# Patient Record
Sex: Female | Born: 1985 | Race: White | Hispanic: No | Marital: Married | Smoking: Never smoker
Health system: Southern US, Community
[De-identification: ages and names within clinical notes are randomized; demographics above are authoritative.]

## PROBLEM LIST (undated history)

## (undated) DIAGNOSIS — G43109 Migraine with aura, not intractable, without status migrainosus: Secondary | ICD-10-CM

## (undated) DIAGNOSIS — N83209 Unspecified ovarian cyst, unspecified side: Secondary | ICD-10-CM

## (undated) DIAGNOSIS — D649 Anemia, unspecified: Secondary | ICD-10-CM

## (undated) DIAGNOSIS — Z975 Presence of (intrauterine) contraceptive device: Secondary | ICD-10-CM

## (undated) HISTORY — DX: Unspecified ovarian cyst, unspecified side: N83.209

## (undated) HISTORY — DX: Anemia, unspecified: D64.9

## (undated) HISTORY — DX: Presence of (intrauterine) contraceptive device: Z97.5

## (undated) HISTORY — DX: Migraine with aura, not intractable, without status migrainosus: G43.109

## (undated) HISTORY — PX: BILATERAL SALPINGECTOMY: SHX5743

---
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
## 2005-11-17 ENCOUNTER — Ambulatory Visit (HOSPITAL_COMMUNITY): Admission: RE | Admit: 2005-11-17 | Discharge: 2005-11-17 | Payer: Self-pay | Admitting: Chiropractic Medicine

## 2006-04-25 ENCOUNTER — Other Ambulatory Visit: Admission: RE | Admit: 2006-04-25 | Discharge: 2006-04-25 | Payer: Self-pay | Admitting: Obstetrics and Gynecology

## 2007-04-26 ENCOUNTER — Other Ambulatory Visit: Admission: RE | Admit: 2007-04-26 | Discharge: 2007-04-26 | Payer: Self-pay | Admitting: Obstetrics and Gynecology

## 2008-05-04 ENCOUNTER — Other Ambulatory Visit: Admission: RE | Admit: 2008-05-04 | Discharge: 2008-05-04 | Payer: Self-pay | Admitting: Obstetrics and Gynecology

## 2009-02-02 ENCOUNTER — Encounter: Admission: RE | Admit: 2009-02-02 | Discharge: 2009-02-02 | Payer: Self-pay | Admitting: Family Medicine

## 2010-08-06 ENCOUNTER — Emergency Department (HOSPITAL_COMMUNITY): Admission: EM | Admit: 2010-08-06 | Discharge: 2010-08-06 | Payer: Self-pay | Admitting: Emergency Medicine

## 2012-02-27 DIAGNOSIS — Z975 Presence of (intrauterine) contraceptive device: Secondary | ICD-10-CM

## 2012-02-27 HISTORY — DX: Presence of (intrauterine) contraceptive device: Z97.5

## 2012-10-27 HISTORY — PX: OTHER SURGICAL HISTORY: SHX169

## 2012-11-09 ENCOUNTER — Encounter: Payer: Self-pay | Admitting: Obstetrics and Gynecology

## 2012-11-09 DIAGNOSIS — G43109 Migraine with aura, not intractable, without status migrainosus: Secondary | ICD-10-CM

## 2012-11-09 DIAGNOSIS — Z01419 Encounter for gynecological examination (general) (routine) without abnormal findings: Secondary | ICD-10-CM | POA: Insufficient documentation

## 2012-11-09 DIAGNOSIS — Z975 Presence of (intrauterine) contraceptive device: Secondary | ICD-10-CM

## 2012-11-09 DIAGNOSIS — Z309 Encounter for contraceptive management, unspecified: Secondary | ICD-10-CM

## 2012-11-27 DIAGNOSIS — N83209 Unspecified ovarian cyst, unspecified side: Secondary | ICD-10-CM

## 2012-11-27 HISTORY — DX: Unspecified ovarian cyst, unspecified side: N83.209

## 2012-12-04 ENCOUNTER — Emergency Department (HOSPITAL_COMMUNITY)
Admission: EM | Admit: 2012-12-04 | Discharge: 2012-12-04 | Disposition: A | Discharge status: Home / Self Care | Payer: BC Managed Care – PPO | Attending: Emergency Medicine | Admitting: Emergency Medicine

## 2012-12-04 ENCOUNTER — Emergency Department (HOSPITAL_COMMUNITY): Payer: BC Managed Care – PPO

## 2012-12-04 ENCOUNTER — Encounter (HOSPITAL_COMMUNITY): Payer: Self-pay | Admitting: Emergency Medicine

## 2012-12-04 DIAGNOSIS — R1031 Right lower quadrant pain: Secondary | ICD-10-CM | POA: Insufficient documentation

## 2012-12-04 DIAGNOSIS — Z8679 Personal history of other diseases of the circulatory system: Secondary | ICD-10-CM | POA: Insufficient documentation

## 2012-12-04 DIAGNOSIS — Z975 Presence of (intrauterine) contraceptive device: Secondary | ICD-10-CM | POA: Insufficient documentation

## 2012-12-04 DIAGNOSIS — N83209 Unspecified ovarian cyst, unspecified side: Secondary | ICD-10-CM | POA: Insufficient documentation

## 2012-12-04 DIAGNOSIS — Z3202 Encounter for pregnancy test, result negative: Secondary | ICD-10-CM | POA: Insufficient documentation

## 2012-12-04 LAB — URINALYSIS, ROUTINE W REFLEX MICROSCOPIC
Bilirubin Urine: NEGATIVE
Glucose, UA: NEGATIVE mg/dL
Hgb urine dipstick: NEGATIVE
Specific Gravity, Urine: <1.005 — ABNORMAL LOW (ref 1.005–1.030)
pH: 6.5 (ref 5.0–8.0)

## 2012-12-04 LAB — CBC WITH DIFFERENTIAL/PLATELET
Eosinophils Relative: 1 % (ref 0–5)
HCT: 37.1 % (ref 36.0–46.0)
Hemoglobin: 12.7 g/dL (ref 12.0–15.0)
Lymphocytes Relative: 43 % (ref 12–46)
MCHC: 34.2 g/dL (ref 30.0–36.0)
MCV: 89 fL (ref 78.0–100.0)
Monocytes Absolute: 0.5 10*3/uL (ref 0.1–1.0)
Monocytes Relative: 6 % (ref 3–12)
Neutro Abs: 3.9 10*3/uL (ref 1.7–7.7)
WBC: 7.9 10*3/uL (ref 4.0–10.5)

## 2012-12-04 LAB — COMPREHENSIVE METABOLIC PANEL
ALT: 11 U/L (ref 0–35)
Albumin: 4.4 g/dL (ref 3.5–5.2)
Alkaline Phosphatase: 62 U/L (ref 39–117)
Glucose, Bld: 97 mg/dL (ref 70–99)
Potassium: 3.7 mEq/L (ref 3.5–5.1)
Sodium: 136 mEq/L (ref 135–145)
Total Protein: 7.3 g/dL (ref 6.0–8.3)

## 2012-12-04 LAB — WET PREP, GENITAL
Trich, Wet Prep: NONE SEEN
Yeast Wet Prep HPF POC: NONE SEEN

## 2012-12-04 LAB — GC/CHLAMYDIA PROBE AMP: CT Probe RNA: NEGATIVE

## 2012-12-04 MED ORDER — IOHEXOL 300 MG/ML  SOLN
100.0000 mL | Freq: Once | INTRAMUSCULAR | Status: AC | PRN
  Administered 2012-12-04: 75 mL via INTRAVENOUS

## 2012-12-04 MED ORDER — HYDROCODONE-ACETAMINOPHEN 5-325 MG PO TABS: 1.0000 | ORAL_TABLET | Freq: Four times a day (QID) | ORAL | Status: DC | PRN

## 2012-12-04 MED ORDER — IOHEXOL 300 MG/ML  SOLN
20.0000 mL | INTRAMUSCULAR | Status: AC
  Administered 2012-12-04: 05:00:00 via ORAL

## 2012-12-04 MED ORDER — IBUPROFEN 600 MG PO TABS: 600.0000 mg | ORAL_TABLET | Freq: Four times a day (QID) | ORAL | Status: DC | PRN

## 2012-12-04 NOTE — ED Notes (Signed)
Patient complaining of lower abdominal pain.  States that she has been having intermittent right sided abdominal cramping for the past couple of days.  Pain severely became worse this evening -- woke up with extreme abdominal pain.  Reports nausea, denies vomiting and diarrhea.  Patient reports that pain has gotten a lot better over the last thirty minutes.

## 2012-12-04 NOTE — ED Notes (Signed)
Patient transported to CT 

## 2012-12-04 NOTE — ED Provider Notes (Signed)
History     CSN: [id number]  Arrival date & time 12/04/12  0109   First MD Initiated Contact with Patient 12/04/12 [phone]      Chief Complaint  Patient presents with  . Abdominal Pain    (Consider location/radiation/quality/duration/timing/severity/associated sxs/prior treatment) HPI Jenna Turner is a 27 y.o. female with a pertinent medical history of an intrauterine device placement in April of this year presents with right lower quadrant pain started about 2 days ago. Today the pain moved overloaded to be periumbilical, however she's still having the right lower quadrant pain, she says it was hurting on the way in over the bumps in the car as well. The pain is moderate to severe, is associated with some pressure after urination, the pain woke her up at about midnight last night and was worse than it had been over the last 2 days and has been a shooting pain. She has a questionable history of ovarian cysts.  She's been nauseous, no vomiting, no diarrhea. No vaginal bleeding, no vaginal discharge, no dyspareunia.  No history of constipation.  Past Medical History  Diagnosis Date  . Migraine with aura     couldn't talk  . IUD (intrauterine device) in place 4 2 13     Mirena inserted 02-27-12    History reviewed. No pertinent past surgical history.  History reviewed. No pertinent family history.  History  Substance Use Topics  . Smoking status: Never Smoker   . Smokeless tobacco: Not on file  . Alcohol Use: No    OB History    Grav Para Term Preterm Abortions TAB SAB Ect Mult Living   0 0              Review of Systems At least 10pt or greater review of systems completed and are negative except where specified in the HPI.  Allergies  Morphine and related and Sulfa antibiotics  Home Medications   Current Outpatient Rx  Name  Route  Sig  Dispense  Refill  . IBUPROFEN 200 MG PO TABS   Oral   Take 200 mg by mouth every 6 (six) hours as needed. For pain         .  LEVONORGESTREL 20 MCG/24HR IU IUD   Intrauterine   1 each by Intrauterine route once.           BP 129/57  Temp 98.6 F (37 C) (Oral)  Resp 18  SpO2 98%  Physical Exam  Nursing notes reviewed.  Electronic medical record reviewed. VITAL SIGNS:   Filed Vitals:   12/04/12 0148 12/04/12 0420  BP: 129/57 100/68  Pulse:  85  Temp: 98.6 F (37 C) 98.2 F (36.8 C)  TempSrc: Oral Oral  Resp: 18 18  SpO2: 98% 99%   CONSTITUTIONAL: Awake, oriented, appears non-toxic HENT: Atraumatic, normocephalic, oral mucosa pink and moist, airway patent. Nares patent without drainage. External ears normal. EYES: Conjunctiva clear, EOMI, PERRLA NECK: Trachea midline, non-tender, supple CARDIOVASCULAR: Normal heart rate, Normal rhythm, No murmurs, rubs, gallops PULMONARY/CHEST: Clear to auscultation, no rhonchi, wheezes, or rales. Symmetrical breath sounds. Non-tender. ABDOMINAL: Non-distended, soft, tenderness to palpation at McBurney's point, no rebound tenderness or guarding. No tenderness in the right flank to percussion  BS normal. NEUROLOGIC: Non-focal, moving all four extremities, no gross sensory or motor deficits. EXTREMITIES: No clubbing, cyanosis, or edema SKIN: Warm, Dry, No erythema, No rash PELVIC EXAM: normal external genitalia, vulva, vagina, cervical ectropion with some cervical motion tenderness, uterus palpates with some  tenderness and adnexal not well palpated due to patient discomfort.  ED Course  Procedures (including critical care time)  Labs Reviewed  COMPREHENSIVE METABOLIC PANEL - Abnormal; Notable for the following:    Creatinine, Ser 0.46 (*)     All other components within normal limits  URINALYSIS, ROUTINE W REFLEX MICROSCOPIC - Abnormal; Notable for the following:    Specific Gravity, Urine <1.005 (*)     All other components within normal limits  WET PREP, GENITAL - Abnormal; Notable for the following:    Clue Cells Wet Prep HPF POC MODERATE (*)     WBC, Wet  Prep HPF POC MODERATE (*)     All other components within normal limits  CBC WITH DIFFERENTIAL  LIPASE, BLOOD  POCT PREGNANCY, URINE  GC/CHLAMYDIA PROBE AMP   Ct Abdomen Pelvis W Contrast  12/04/2012  *RADIOLOGY REPORT*  Clinical Data: Abdominal pain.  CT ABDOMEN AND PELVIS WITH CONTRAST  Technique:  Multidetector CT imaging of the abdomen and pelvis was performed following the standard protocol during bolus administration of intravenous contrast.  Contrast: 75mL OMNIPAQUE IOHEXOL 300 MG/ML  SOLN  Comparison: CT abdomen and pelvis 12/27/2007.  Findings: Minimal linear atelectasis or scar is seen in the right lung base.  Lung bases otherwise clear.  No pleural or pericardial effusion.  The gallbladder, liver, spleen, adrenal glands, pancreas and kidneys appear normal.  The patient has a left ovarian cyst which measures 3.9 cm AP by 3.6 cm transverse by 4.4 cm cranial-caudal.  There is a small volume of free pelvic fluid which is greater than typically seen in physiologic change.  Right ovary is unremarkable.  IUD is in place in the uterus.  The stomach and small and large bowel appear normal.  The appendix is not discretely visualized but no evidence inflammatory process is seen in the right lower quadrant.  There is no lymphadenopathy.  No focal bony abnormality.  IMPRESSION:  Findings suggestive of partial rupture of a left ovarian cyst.  The study is otherwise unremarkable.   Original Report Authenticated By: Holley Dexter, M.D.      1. Ovarian cyst   2. Abdominal pain, acute, right lower quadrant       MDM  Jenna Turner is a 27 y.o. female presenting with right lower quadrant pain and some associated middle abdominal pain associated with nausea. Patient's presentation is concerning for appendicitis, history is not consistent with pelvic inflammatory disease at this time, I do not think she's got TOA, or ovarian torsion, no pain in the upper abdomen, do not think this is biliary tract issue  or gallbladder problem, patient is nontoxic and afebrile and likewise I don't think it's a perforated viscus. Patient says she does have a history of ovarian cyst but she's not sure what side it was on her habit she was at that time it was found earlier this year.  Labs are unremarkable, no elevated white count, no elevation in liver function testing, urinalysis does not indicate UTI. No concern for status this time either.  Discussed obtaining a CT of the abdomen and pelvis with contrast with the patient, patient agrees with the risks and benefits described risks being radiation exposure but is being hallucination of right lower part of pain and to rule out appendicitis.  Wet prep mentions moderate clue cells and moderate wbc's, do not think this represents a vaginitis or vaginosis as the patient is asymptomatic and I will not treat at this time.  CT of the  abdomen and pelvis as interpreted by radiology shows partial rupture of left ovarian cyst-study is otherwise unremarkable. On my viewing of the CT, there is a golf ball size cyst on the left it is displacing the uterus and some other pelvic organs, shown CT to patient-she understands pain and words likely coming from. Discussed reasons to return to the emergency department including the possibility of an early appendicitis being masked by cyst, patient understands if any changes, worsens or if any other new or concerning symptoms should arise she should return to the ER for further evaluation.  Patient will followup with her OB/GYN Dr. Keene Breath for ultimate dscision on how to approach ovarian cyst-in the meantime we'll give the patient some pain medicine to go home with.  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  We'll send her home with Vicodin and ibuprofen.       Jones Skene, MD 12/04/12 [phone]

## 2012-12-04 NOTE — ED Notes (Signed)
Patient triaged at 0115; EPIC in downtime; some information entered afterwards (around 0145).

## 2012-12-04 NOTE — ED Notes (Signed)
MD at bedside. 

## 2012-12-04 NOTE — ED Notes (Signed)
Reports pressure during urination; denies hematuria.

## 2012-12-12 ENCOUNTER — Encounter: Payer: Self-pay | Admitting: *Deleted

## 2012-12-13 ENCOUNTER — Encounter: Payer: Self-pay | Admitting: *Deleted

## 2013-02-11 ENCOUNTER — Ambulatory Visit: Payer: Self-pay | Admitting: Obstetrics and Gynecology

## 2013-02-28 ENCOUNTER — Ambulatory Visit: Payer: Self-pay | Admitting: Obstetrics and Gynecology

## 2013-03-04 ENCOUNTER — Ambulatory Visit: Payer: Self-pay | Admitting: Gynecology

## 2013-07-02 ENCOUNTER — Ambulatory Visit: Payer: Self-pay | Admitting: Obstetrics and Gynecology

## 2013-07-23 ENCOUNTER — Encounter: Payer: Self-pay | Admitting: Obstetrics and Gynecology

## 2013-07-23 ENCOUNTER — Ambulatory Visit (INDEPENDENT_AMBULATORY_CARE_PROVIDER_SITE_OTHER): Payer: 59 | Admitting: Obstetrics and Gynecology

## 2013-07-23 VITALS — BP 113/65 | HR 79 | Resp 18 | Ht 65.5 in | Wt 153.0 lb

## 2013-07-23 DIAGNOSIS — Z01419 Encounter for gynecological examination (general) (routine) without abnormal findings: Secondary | ICD-10-CM

## 2013-07-23 DIAGNOSIS — Z0000 Encounter for general adult medical examination without abnormal findings: Secondary | ICD-10-CM

## 2013-07-23 LAB — POCT URINALYSIS DIPSTICK
Blood, UA: NEGATIVE
Protein, UA: NEGATIVE
Urobilinogen, UA: NEGATIVE

## 2013-07-23 NOTE — Patient Instructions (Signed)
EXERCISE AND DIET:  We recommended that you start or continue a regular exercise program for good health. Regular exercise means any activity that makes your heart beat faster and makes you sweat.  We recommend exercising at least 30 minutes per day at least 3 days a week, preferably 4 or 5.  We also recommend a diet low in fat and sugar.  Inactivity, poor dietary choices and obesity can cause diabetes, heart attack, stroke, and kidney damage, among others.    ALCOHOL AND SMOKING:  Women should limit their alcohol intake to no more than 7 drinks/beers/glasses of wine (combined, not each!) per week. Moderation of alcohol intake to this level decreases your risk of breast cancer and liver damage. And of course, no recreational drugs are part of a healthy lifestyle.  And absolutely no smoking or even second hand smoke. Most people know smoking can cause heart and lung diseases, but did you know it also contributes to weakening of your bones? Aging of your skin?  Yellowing of your teeth and nails?  CALCIUM AND VITAMIN D:  Adequate intake of calcium and Vitamin D are recommended.  The recommendations for exact amounts of these supplements seem to change often, but generally speaking 600 mg of calcium (either carbonate or citrate) and 800 units of Vitamin D per day seems prudent. Certain women may benefit from higher intake of Vitamin D.  If you are among these women, your doctor will have told you during your visit.    PAP SMEARS:  Pap smears, to check for cervical cancer or precancers,  have traditionally been done yearly, although recent scientific advances have shown that most women can have pap smears less often.  However, every woman still should have a physical exam from her gynecologist every year. It will include a breast check, inspection of the vulva and vagina to check for abnormal growths or skin changes, a visual exam of the cervix, and then an exam to evaluate the size and shape of the uterus and  ovaries.  And after 27 years of age, a rectal exam is indicated to check for rectal cancers. We will also provide age appropriate advice regarding health maintenance, like when you should have certain vaccines, screening for sexually transmitted diseases, bone density testing, colonoscopy, mammograms, etc.      

## 2013-07-23 NOTE — Progress Notes (Signed)
27 y.o.   Single    Caucasian   female   G0P0   here for annual exam.  No menses with Mirena.  Migraines are better.  Last one was in the spring.  Just finished a course of Accutane for acne.  Patient's last menstrual period was 06/27/2013.          Sexually active: yes  The current method of family planning is IUD.    Exercising: walking 2-3 days a week, golf Last mammogram:  never Last pap smear:01/02/11 neg History of abnormal pap: no Smoking:never Alcohol:occ beer Last colonoscopy: never Last Bone Density:  never Last tetanus shot: less than 10 years Last cholesterol check: never  Hgb: 12.6                Urine: neg   History reviewed. No pertinent family history.  Patient Active Problem List   Diagnosis Date Noted  . Contraception management 11/09/2012  . Pap smear, as part of routine gynecological examination 11/09/2012  . Migraine with aura   . IUD (intrauterine device) in place 02/27/2012    Past Medical History  Diagnosis Date  . Migraine with aura     couldn't talk  . IUD (intrauterine device) in place 4 2 13     Mirena inserted 02-27-12  . Ovarian cyst 11/2012    left    Past Surgical History  Procedure Laterality Date  . Gum graft  10/2012    Allergies: Morphine and related and Sulfa antibiotics  Current Outpatient Prescriptions  Medication Sig Dispense Refill  . ibuprofen (ADVIL,MOTRIN) 200 MG tablet Take 200 mg by mouth every 6 (six) hours as needed. For pain      . levonorgestrel (MIRENA) 20 MCG/24HR IUD 1 each by Intrauterine route once.       No current facility-administered medications for this visit.    ROS: Pertinent items are noted in HPI.  Social Hx: single, no children, works in Doctor, general practice.  Has a masters in Education officer, community and is working on an [employer]  Exam:    BP 113/65  Pulse 79  Resp 18  Ht 5' 5.5" (1.664 m)  Wt 153 lb (69.4 kg)  BMI 25.06 kg/m2  LMP 08/01/2014Ht stable and wt down 7 pounds from last yr   Wt Readings from Last 3  Encounters:  07/23/13 153 lb (69.4 kg)     Ht Readings from Last 3 Encounters:  07/23/13 5' 5.5" (1.664 m)    General appearance: alert, cooperative and appears stated age Head: Normocephalic, without obvious abnormality, atraumatic Neck: no adenopathy, supple, symmetrical, trachea midline and thyroid not enlarged, symmetric, no tenderness/mass/nodules Lungs: clear to auscultation bilaterally Breasts: Inspection negative, No nipple retraction or dimpling, No nipple discharge or bleeding, No axillary or supraclavicular adenopathy, Normal to palpation without dominant masses Heart: regular rate and rhythm Abdomen: soft, non-tender; bowel sounds normal; no masses,  no organomegaly Extremities: extremities normal, atraumatic, no cyanosis or edema Skin: Skin color, texture, turgor normal. No rashes or lesions Lymph nodes: Cervical, supraclavicular, and axillary nodes normal. No abnormal inguinal nodes palpated Neurologic: Grossly normal   Pelvic: External genitalia:  no lesions              Urethra:  normal appearing urethra with no masses, tenderness or lesions              Bartholins and Skenes: normal                 Vagina: normal appearing vagina with  normal color and discharge, no lesions              Cervix: normal appearance, IUD strings seen              Pap taken: no        Bimanual Exam:  Uterus:  uterus is normal size, shape, consistency and nontender                                      Adnexa: normal adnexa in size, nontender and no masses                                      Rectovaginal: Confirms   A: normal gyn exam, Mirena inserted February 27, 2012     Migraine with aura (couldn't talk)     P:     mammogram counseled on breast self exam, mammography screening, adequate intake of calcium and vitamin D, diet and exercise return annually or prn     An After Visit Summary was printed and given to the patient.

## 2013-08-03 ENCOUNTER — Encounter (HOSPITAL_COMMUNITY): Payer: Self-pay | Admitting: *Deleted

## 2013-08-03 ENCOUNTER — Emergency Department (HOSPITAL_COMMUNITY)
Admission: EM | Admit: 2013-08-03 | Discharge: 2013-08-03 | Disposition: A | Discharge status: Home / Self Care | Payer: 59 | Attending: Emergency Medicine | Admitting: Emergency Medicine

## 2013-08-03 ENCOUNTER — Emergency Department (HOSPITAL_COMMUNITY)
Admission: EM | Admit: 2013-08-03 | Discharge: 2013-08-03 | Disposition: A | Discharge status: Nursing Home (Includes ICF) | Payer: 59 | Source: Home / Self Care | Attending: Family Medicine | Admitting: Family Medicine

## 2013-08-03 DIAGNOSIS — L03213 Periorbital cellulitis: Secondary | ICD-10-CM

## 2013-08-03 DIAGNOSIS — Z8679 Personal history of other diseases of the circulatory system: Secondary | ICD-10-CM | POA: Insufficient documentation

## 2013-08-03 DIAGNOSIS — Z8742 Personal history of other diseases of the female genital tract: Secondary | ICD-10-CM | POA: Insufficient documentation

## 2013-08-03 DIAGNOSIS — Z792 Long term (current) use of antibiotics: Secondary | ICD-10-CM | POA: Insufficient documentation

## 2013-08-03 DIAGNOSIS — L0201 Cutaneous abscess of face: Secondary | ICD-10-CM | POA: Insufficient documentation

## 2013-08-03 DIAGNOSIS — H00016 Hordeolum externum left eye, unspecified eyelid: Secondary | ICD-10-CM

## 2013-08-03 DIAGNOSIS — H00039 Abscess of eyelid unspecified eye, unspecified eyelid: Secondary | ICD-10-CM

## 2013-08-03 DIAGNOSIS — H00019 Hordeolum externum unspecified eye, unspecified eyelid: Secondary | ICD-10-CM | POA: Insufficient documentation

## 2013-08-03 DIAGNOSIS — L03211 Cellulitis of face: Secondary | ICD-10-CM | POA: Insufficient documentation

## 2013-08-03 MED ORDER — DEXTROSE 5 % IV SOLN
1.0000 g | Freq: Once | INTRAVENOUS | Status: AC
  Administered 2013-08-03: 1 g via INTRAVENOUS
  Filled 2013-08-03: qty 10

## 2013-08-03 MED ORDER — SODIUM CHLORIDE 0.9 % IV SOLN
Freq: Once | INTRAVENOUS | Status: AC
  Administered 2013-08-03: 14:00:00 via INTRAVENOUS

## 2013-08-03 MED ORDER — VANCOMYCIN HCL IN DEXTROSE 1-5 GM/200ML-% IV SOLN
1000.0000 mg | Freq: Once | INTRAVENOUS | Status: AC
  Administered 2013-08-03: 1000 mg via INTRAVENOUS
  Filled 2013-08-03: qty 200

## 2013-08-03 MED ORDER — DOXYCYCLINE HYCLATE 100 MG PO CAPS: 100.0000 mg | ORAL_CAPSULE | Freq: Two times a day (BID) | ORAL | Status: DC

## 2013-08-03 NOTE — ED Notes (Signed)
Report called to [facility], ED First Nurse.  Informed of Dr. Nolon Lennert request for 1g IV Vancomycin and 1g IV Rocephin; instructed that ED is to call opthalmologist as soon as pt arrives in dept.  Kayla verbalized understanding.

## 2013-08-03 NOTE — ED Provider Notes (Signed)
CSN: [id number]     Arrival date & time 08/03/13  1321 History   First MD Initiated Contact with Patient 08/03/13 1332     Chief Complaint  Patient presents with  . eye swelling    (Consider location/radiation/quality/duration/timing/severity/associated sxs/prior Treatment) HPI Comments: Patient presents with a chief complaint of pain and swelling of the left eye.  She reports that her symptoms have been present for the past 2 days and gradually worsening.  She took Zyrtec for her symptoms yesterday because she thought that the swelling of the eye may be related to allergies.  However, she does not report any improvement after taking the Zyrtec.  She denies fever, chills, or changes in her vision.  She reports that she does have some pain of her periorbital area.  She was seen at [facility] just prior to arrival and was diagnosed with a Preseptal Cellulitis.  Dr. Delaney Meigs with Opthalmology was consulted by [facility] physician and recommended that the patient come to the ED for IV Rocephin and IV Vancomycin.  Dr. Delaney Meigs is going to come evaluated the patient in the ED.  The history is provided by the patient.    Past Medical History  Diagnosis Date  . Migraine with aura     couldn't talk  . IUD (intrauterine device) in place 4 2 13     Mirena inserted 02-27-12  . Ovarian cyst 11/2012    left   Past Surgical History  Procedure Laterality Date  . Gum graft  10/2012   Family History  Problem Relation Age of Onset  . Osteopenia Mother   . Hearing loss Mother    History  Substance Use Topics  . Smoking status: Never Smoker   . Smokeless tobacco: Never Used  . Alcohol Use: 0.0 oz/week     Comment: occasionally   OB History   Grav Para Term Preterm Abortions TAB SAB Ect Mult Living   0 0             Review of Systems  Eyes: Positive for pain. Negative for photophobia and visual disturbance.       Eye swelling  All other systems reviewed and are negative.    Allergies  Morphine and  related and Sulfa antibiotics  Home Medications   Current Outpatient Rx  Name  Route  Sig  Dispense  Refill  . doxycycline (VIBRAMYCIN) 100 MG capsule   Oral   Take 1 capsule (100 mg total) by mouth 2 (two) times daily.   20 capsule   0   . ibuprofen (ADVIL,MOTRIN) 200 MG tablet   Oral   Take 200 mg by mouth every 6 (six) hours as needed. For pain         . levonorgestrel (MIRENA) 20 MCG/24HR IUD   Intrauterine   1 each by Intrauterine route once.          BP 113/70  Pulse 88  Temp(Src) 97.8 F (36.6 C) (Oral)  Resp 18  SpO2 100%  LMP 06/27/2013 Physical Exam  Nursing note and vitals reviewed. Constitutional: She appears well-developed and well-nourished.  HENT:  Head: Normocephalic and atraumatic.  Eyes: Conjunctivae and EOM are normal. Pupils are equal, round, and reactive to light. Left eye exhibits hordeolum. Left eye exhibits no exudate.  No pain with EOM  Periorbital swelling and erythema  Cardiovascular: Normal rate, regular rhythm and normal heart sounds.   Pulmonary/Chest: Effort normal and breath sounds normal.  Neurological: She is alert.  Skin: Skin is warm and  dry.  Psychiatric: She has a normal mood and affect.    ED Course  Procedures (including critical care time) Labs Review Labs Reviewed - No data to display Imaging Review No results found.  2:33 PM Discussed with Dr. Delaney Meigs who reports that he will come evaluate the patient in the ED.  MDM  No diagnosis found. Patient with preseptal cellulitis and hordeolum of the left eye.  Patient given IV antibiotics in the ED.  Patient evaluated by Dr. Delaney Meigs in the ED.  Dr. Delaney Meigs called in antibiotics to the pharmacy for the patient and set up a follow up appointment in his office tomorrow.  Patient is stable for discharge.    Pascal Lux [place], PA-C 08/03/13 1659

## 2013-08-03 NOTE — ED Provider Notes (Signed)
CSN: [id number]     Arrival date & time 08/03/13  1233 History   First MD Initiated Contact with Patient 08/03/13 1246     Chief Complaint  Patient presents with  . Eye Problem   (Consider location/radiation/quality/duration/timing/severity/associated sxs/prior Treatment) Patient is a 27 y.o. female presenting with eye problem. The history is provided by the patient.  Eye Problem Location:  L eye Quality:  Aching Severity:  Moderate Onset quality:  Sudden Duration:  2 days Progression:  Worsening Chronicity:  New Context comment:  Onset in right lower lid 4 days ago that spont resolved, then developed in left eye. lower lid. Associated symptoms: no discharge, no itching, no photophobia and no redness     Past Medical History  Diagnosis Date  . Migraine with aura     couldn't talk  . IUD (intrauterine device) in place 4 2 13     Mirena inserted 02-27-12  . Ovarian cyst 11/2012    left   Past Surgical History  Procedure Laterality Date  . Gum graft  10/2012   Family History  Problem Relation Age of Onset  . Osteopenia Mother   . Hearing loss Mother    History  Substance Use Topics  . Smoking status: Never Smoker   . Smokeless tobacco: Never Used  . Alcohol Use: 0.0 oz/week     Comment: occasionally   OB History   Grav Para Term Preterm Abortions TAB SAB Ect Mult Living   0 0             Review of Systems  Constitutional: Negative.   HENT: Negative.   Eyes: Negative for photophobia, discharge, redness and itching.    Allergies  Morphine and related and Sulfa antibiotics  Home Medications   Current Outpatient Rx  Name  Route  Sig  Dispense  Refill  . levonorgestrel (MIRENA) 20 MCG/24HR IUD   Intrauterine   1 each by Intrauterine route once.         . doxycycline (VIBRAMYCIN) 100 MG capsule   Oral   Take 1 capsule (100 mg total) by mouth 2 (two) times daily.   20 capsule   0   . ibuprofen (ADVIL,MOTRIN) 200 MG tablet   Oral   Take 200 mg by mouth  every 6 (six) hours as needed. For pain          BP 107/66  Pulse 80  Temp(Src) 98.6 F (37 C) (Oral)  Resp 16  SpO2 100%  LMP 06/27/2013 Physical Exam  Nursing note and vitals reviewed. Constitutional: She appears well-developed and well-nourished.  HENT:  Head: Normocephalic.  Eyes: Conjunctivae and EOM are normal. Pupils are equal, round, and reactive to light. Right eye exhibits no discharge. Left eye exhibits no discharge.      ED Course  Procedures (including critical care time) Labs Review Labs Reviewed - No data to display Imaging Review No results found.  MDM  Sent for iv 1gm vancomycin and 1 gm rocephin and call dr Delaney Meigs asap for further eval and care    Linna Hoff, MD 08/03/13 1321

## 2013-08-03 NOTE — ED Notes (Signed)
Stated had swelling to inner corner of right eye 3-4 days ago that went away after one day; 2 days ago woke up with inner corner of left eye swollen, red, and tender.  Today has significant left eye periorbital swelling, tightness, and pressure with sharp pains in corner of eye when she blinks.  Denies any drainage.  Pt wearing glasses.  Reports having had "head cold" last week.

## 2013-08-03 NOTE — ED Notes (Signed)
Pt is here with left eye swelling, redness and sent from urgent care.  No change in vision.

## 2013-08-03 NOTE — Consult Note (Signed)
  Ophthalmology Tyrone Schimke, MD 9.7.14   26 yowf with preseptal cellulitis/hordeolum of 24 hours duration  BCVA 20/20 20/20  IOP 20 20  SLE NORMAL  EOM NORMAL  FUNDUS NOT EXAMINED  EXTERNAL EXAM:  THE PATIENT HAS A PRESEPTAL INFERIOR CELLULITIS OF MILD EXTENT.  SHE ALSO HAS A HORDEOLUM AT THE NASAL ASPECT OF THE LID MARGIN WITHOUT EXTERNALIZATION.  SHE DOES NOT HAVE A CANALICULITIS.  IMPRESSION:  HORDEOLUM/PRESEPTAL CELLULITIS-SUSPECT STAPH  PLAN 1. VANCO 1 G IV X1 2. CEFTRIAXONE 1 G IV X1 3. DOXYCYCLINE 100 MG PO BID FOR 2 WEEKS. 4. AZASITE ONE DROP OU BID TILL THE BOTTLE IS GONE 5. FU KGS TOMORROW AT 0900 AT [facility].  IF SHE WORSENS OVERNIGHT I HAVE TOLD HER TO RETURN TO THE ER FOR ADDITIONAL IV ANTIBIOTICS. 6. WARM COMPRESSES TO AREA EVERY 2 HOURS

## 2013-08-03 NOTE — ED Notes (Signed)
Pt presents with redness and swelling to Left eye x2 days. Pt reports she had a head cold 1 week ago and developed redness and swelling to her Right eye 3-4 days ago, she woke up this am with same symptoms in her Left eye. Pt reports some clear and purulent drainage. Pt denies blurred vision, fever, cough, or congestion

## 2013-08-04 NOTE — ED Provider Notes (Signed)
Medical screening examination/treatment/procedure(s) were performed by non-physician practitioner and as supervising physician I was immediately available for consultation/collaboration.  Hurman Horn, MD 08/04/13 [phone]

## 2013-11-10 ENCOUNTER — Telehealth: Payer: Self-pay | Admitting: Obstetrics and Gynecology

## 2013-11-10 DIAGNOSIS — N9489 Other specified conditions associated with female genital organs and menstrual cycle: Secondary | ICD-10-CM

## 2013-11-10 NOTE — Telephone Encounter (Signed)
Patient was returning Amy's call.

## 2013-11-10 NOTE — Telephone Encounter (Signed)
Pt had a cyst that burst over the weekend and would like to come in for follow up and have a ultrasound done.

## 2013-11-10 NOTE — Telephone Encounter (Signed)
Spoke with pt about pain she had over the weekend while in [facility]. Pt has hx of ovarian cysts and was seen in ER here last January. Hospital in [facility] did not have an [place], so she was told to follow up with her GYN. Pt was given hydrocodone, but has stopped taking it and is now on ibuprofen. Pt still having some pain on day 3, and she says her pain resolved after a day or so with her last cyst. Pt says pain started as lower abdominal pain and is now more like full abdominal pain. Advised pt we will consult with another MD as Dr. Edward Jolly is out of office this week and call her back for appt. Pt agreeable.

## 2013-11-10 NOTE — Telephone Encounter (Signed)
Please call and assess pt.  If better, ok to wait until Thurs.  If needs U/S sooner, tomorrow ok with Dr. Farrel Gobble.

## 2013-11-10 NOTE — Telephone Encounter (Signed)
Call to patient, Reports she woke Sat am to pain 8-9/10. Very similar to pain in Jan 2014 that was a cyst so went to ED in [facility]. Has IUD in place and no menses for last 9 months.   CT scan done, "inconclusive" but large amount of fluid in abdomen.  No ultrasound available. Was sent home with Hydrocodone and Doxycycline.  Now just taking Motin 3 tabs Q6-8 hrs.  Inst to increase to 800 mg Q8hrs. She states pain is better but certainly still very present.  1-2/10 when still but 4/10 with any movement. Out of work today.  Pain is diffuse but worse on right side.  Mostly lower abdomen and worse when she pushed on that side.  Asked patient if worse when she lets go and patient states no, releasing pressure relieves pain. Had BM Friday and then nothing until loose BM today. Patient states this is unusual for her but thought related to Hydrocodone. Denies nausea, vomiting or fever. Possible low grade yesterday but not enough that she ever checked temp. Wth previous cyst, Pain peaked and quickly resolved.  Concerned if this is normal since not resolving.  Was to to follow-up with GYN when returned home.  Reviewed info with Dr Hyacinth Meeker. Strict precautions to go to [facility] for increased pain, nausea, vomiting or any chance she is not comfortable with.  Inst to actually take temperature and call if fever. Will do PUS tomorrow in office. Patient agreeable, states she will just feel better to check. Appt tomm at 1245 in office.

## 2013-11-10 NOTE — Telephone Encounter (Signed)
LMTCB  aa 

## 2013-11-11 ENCOUNTER — Ambulatory Visit (INDEPENDENT_AMBULATORY_CARE_PROVIDER_SITE_OTHER): Payer: 59 | Admitting: Gynecology

## 2013-11-11 ENCOUNTER — Other Ambulatory Visit: Payer: Self-pay | Admitting: *Deleted

## 2013-11-11 ENCOUNTER — Ambulatory Visit (INDEPENDENT_AMBULATORY_CARE_PROVIDER_SITE_OTHER): Payer: 59

## 2013-11-11 VITALS — BP 103/74 | HR 97 | Resp 18 | Ht 65.5 in | Wt 155.0 lb

## 2013-11-11 DIAGNOSIS — Z8669 Personal history of other diseases of the nervous system and sense organs: Secondary | ICD-10-CM

## 2013-11-11 DIAGNOSIS — N9489 Other specified conditions associated with female genital organs and menstrual cycle: Secondary | ICD-10-CM

## 2013-11-11 DIAGNOSIS — N83209 Unspecified ovarian cyst, unspecified side: Secondary | ICD-10-CM | POA: Insufficient documentation

## 2013-11-11 LAB — CBC
HCT: 32.1 % — ABNORMAL LOW (ref 36.0–46.0)
Hemoglobin: 11.1 g/dL — ABNORMAL LOW (ref 12.0–15.0)
MCHC: 34.6 g/dL (ref 30.0–36.0)
MCV: 87.5 fL (ref 78.0–100.0)
RDW: 12.2 % (ref 11.5–15.5)
WBC: 5.6 10*3/uL (ref 4.0–10.5)

## 2013-11-11 NOTE — Patient Instructions (Signed)
If not feeling better, return to office in 1w Please call for acute onset of pain, nausea coming in waves, generalized malaise No aggressive activity-no sports, sex heavy or repetitive lifting. Motrin or aleve ok to take Heating pad ok

## 2013-11-11 NOTE — Progress Notes (Signed)
     Pt reports she woke Sat am to pain 8-9/10. Very similar to pain in Jan 2014 that was a cyst so went to ED in [facility].  Has IUD in place and no menses for last 9 months.  CT scan done, "inconclusive" but large amount of fluid in abdomen. No ultrasound available.  Was sent home with Hydrocodone and Doxycycline. Now just taking Motin 3 tabs Q6-8 hrs. Inst to increase to 800 mg Q8hrs.  She states pain is better but certainly still very present. 1-2/10 when still but 4/10 with any movement. Out of work today.  Pain is diffuse but worse on right side.  Pt here for u/s for evaluation.  Images reviewed and compared with images from 1/14.  She appears to have 2 hemorrhagic ovarian cyst on the right. 5.5x4 and 5x 4.6cm no internal blood flow. There is blood in cul de sac and anterior pelvis that appears clotted.  She is feeling a bit better today than over the weekend.  this is her second hemorrhgiac cyst in less than 1y.  Pt had been on nuvaring in the past but was changed to IUD due to history of migraines with aura.  Pt reports having been evaluated by neurologist in past who was ok with her using ocp with estrogen.  She has rare migraines now and has not seen neurology in years.    BP 103/74  Pulse 97  Resp 18  Ht 5' 5.5" (1.664 m)  Wt 155 lb (70.308 kg)  BMI 25.39 kg/m2 General appearance: alert, cooperative and appears stated age Abdomen: normal findings: bowel sounds normal, no guarding, mild rebound, min distension? Skin: warm and dry    Assessment: Recurrent hemorrhagic cyst with rupture-stable history of migraines  Plan: Pt stable-will get cbc now Reviewed limited activities and signs of torsion Will refer pt back to neurology to clear ocp-pt would prefer to change back to nuvaring Repeat u/s in 4w  Questions addressed 41m spent discussing contraception with migraines and ovarian cysts, >50% face to face

## 2013-11-12 ENCOUNTER — Encounter (HOSPITAL_COMMUNITY): Payer: Self-pay | Admitting: Emergency Medicine

## 2013-11-12 ENCOUNTER — Telehealth: Payer: Self-pay | Admitting: *Deleted

## 2013-11-12 ENCOUNTER — Emergency Department (HOSPITAL_COMMUNITY)
Admission: EM | Admit: 2013-11-12 | Discharge: 2013-11-12 | Disposition: A | Discharge status: Home / Self Care | Payer: 59 | Attending: Emergency Medicine | Admitting: Emergency Medicine

## 2013-11-12 ENCOUNTER — Telehealth: Payer: Self-pay | Admitting: Gynecology

## 2013-11-12 DIAGNOSIS — R000 Tachycardia, unspecified: Secondary | ICD-10-CM | POA: Insufficient documentation

## 2013-11-12 DIAGNOSIS — Z8742 Personal history of other diseases of the female genital tract: Secondary | ICD-10-CM | POA: Insufficient documentation

## 2013-11-12 DIAGNOSIS — R040 Epistaxis: Secondary | ICD-10-CM | POA: Insufficient documentation

## 2013-11-12 DIAGNOSIS — Z8679 Personal history of other diseases of the circulatory system: Secondary | ICD-10-CM | POA: Insufficient documentation

## 2013-11-12 MED ORDER — OXYMETAZOLINE HCL 0.05 % NA SOLN
3.0000 | Freq: Once | NASAL | Status: AC
  Administered 2013-11-12: 3 via NASAL
  Filled 2013-11-12: qty 15

## 2013-11-12 MED ORDER — OXYMETAZOLINE HCL 0.05 % NA SOLN: 1.0000 | Freq: Once | NASAL | Status: DC

## 2013-11-12 NOTE — ED Notes (Signed)
Pt reports nosebleed that started this morning at 5am and lasted for about 50 minutes and sts she can feel the blood drip down her throat. Bleeding has stopped upon arrival here, and pt also thinks the bleeding has stopped because "a clot has formed in the back of my nose." reports lightheadedness. Airway intact, pt A&O. Pt reports ovarian cyst rupture on Saturday.

## 2013-11-12 NOTE — Telephone Encounter (Signed)
Left Message To Call Back  

## 2013-11-12 NOTE — ED Provider Notes (Signed)
CSN: [id number]     Arrival date & time 11/12/13  0618 History   First MD Initiated Contact with Patient 11/12/13 [phone]     Chief Complaint  Patient presents with  . Epistaxis   (Consider location/radiation/quality/duration/timing/severity/associated sxs/prior Treatment) HPI Comments: Jenna Turner is a 27 y.o. female who states that she began having nosebleeding at 5 AM today, spontaneously. She had a little bit of rhinorrhea yesterday, but no other sinus symptoms. She denies weakness, dizziness, nausea, vomiting, or persistent problem with nosebleeds. She's not on any new medications. There are no other known modifying factors.   Patient is a 27 y.o. female presenting with nosebleeds. The history is provided by the patient.  Epistaxis   Past Medical History  Diagnosis Date  . Migraine with aura     couldn't talk  . IUD (intrauterine device) in place 4 2 13     Mirena inserted 02-27-12  . Ovarian cyst 11/2012    left   Past Surgical History  Procedure Laterality Date  . Gum graft  10/2012   Family History  Problem Relation Age of Onset  . Osteopenia Mother   . Hearing loss Mother    History  Substance Use Topics  . Smoking status: Never Smoker   . Smokeless tobacco: Never Used  . Alcohol Use: 0.0 oz/week     Comment: occasionally   OB History   Grav Para Term Preterm Abortions TAB SAB Ect Mult Living   0 0             Review of Systems  HENT: Positive for nosebleeds.   All other systems reviewed and are negative.    Allergies  Morphine and related and Sulfa antibiotics  Home Medications   Current Outpatient Rx  Name  Route  Sig  Dispense  Refill  . doxycycline (VIBRA-TABS) 100 MG tablet   Oral   Take 100 mg by mouth 2 (two) times daily. Ruptured ovarian cyst 10 day treatment beginning 11/08/13         . ibuprofen (ADVIL,MOTRIN) 200 MG tablet   Oral   Take 200-600 mg by mouth every 6 (six) hours as needed for moderate pain or cramping. For pain          . levonorgestrel (MIRENA) 20 MCG/24HR IUD   Intrauterine   1 each by Intrauterine route once.          BP 122/75  Pulse 99  Temp(Src) 98.1 F (36.7 C) (Oral)  Resp 18  SpO2 99% Physical Exam  Constitutional: She is oriented to person, place, and time. She appears well-developed and well-nourished. No distress.  HENT:  Head: Normocephalic and atraumatic.  Right Ear: External ear normal.  Left Ear: External ear normal.  Small amount of blood in left posterior nares without apparent active bleeding or obvious site for bleeding in the anterior nose.  Eyes: Conjunctivae and EOM are normal. Pupils are equal, round, and reactive to light.  Neck: Normal range of motion. Neck supple.  Cardiovascular:  Tachycardia  Pulmonary/Chest: Effort normal.  Musculoskeletal: Normal range of motion.  Neurological: She is alert and oriented to person, place, and time. No cranial nerve deficit. She exhibits normal muscle tone. Coordination normal.  Skin: Skin is warm and dry.  Psychiatric: She has a normal mood and affect. Her behavior is normal. Judgment and thought content normal.    ED Course  Procedures (including critical care time) Doubt metabolic instability, serious bacterial infection or impending vascular collapse; the patient is  stable for discharge.  Patient Vitals for the past 24 hrs:  BP Temp Temp src Pulse Resp SpO2  11/12/13 0718 - - - 99 - 99 %  11/12/13 0717 - - - - - 99 %  11/12/13 0623 122/75 mmHg 98.1 F (36.7 C) Oral 117 18 97 %    8:35 AM Reevaluation with update and discussion. After initial assessment and treatment, an updated evaluation reveals no bleeding since instillation of Afrin. Heart rate improved. Findings discussed, and questions answered. Kinzee Happel L    Labs Review Labs Reviewed - No data to display Imaging Review US Transvaginal Non-ob  11/11/2013   SEE PROGRESS NOTES FOR RESULTS    EKG Interpretation   None       MDM   1. Epistaxis        Epistaxis nonspecific cause, improved with decongestant spray. No active bleeding at the time of discharge. Suspect posterior source for bleeding.  The patient appears reasonably screened and/or stabilized for discharge and I doubt any other medical condition or other [facility] requiring further screening, evaluation, or treatment in the ED at this time prior to discharge.  Nursing Notes Reviewed/ Care Coordinated, and agree without changes. Applicable Imaging Reviewed.  Interpretation of Laboratory Data incorporated into ED treatment   Plan: Home Medications- Afrin; Home Treatments and Observation- rest; return here if the recommended treatment, does not improve the symptoms; Recommended follow up- PCP prn    Flint Melter, MD 11/12/13 [phone]

## 2013-11-12 NOTE — Telephone Encounter (Signed)
On Call Note 0530:  Was evaluated for ovarian cyst recently.  Awoke now with nose bleed and wondered if related to ovarian cyst.  Discussed with patient that it is unrelated.  Reviewed first aid for nose bleeds with recommendation for ER evaluation if continues.

## 2013-11-12 NOTE — ED Notes (Signed)
nad noted, vss, alert and oriented

## 2013-11-12 NOTE — Telephone Encounter (Signed)
Message copied by Lorraine Lax on Wed Nov 12, 2013  8:54 AM ------      Message from: Douglass Rivers      Created: Wed Nov 12, 2013  8:25 AM       Inform a little anemic, to be expected, can take over the counter iron and will recheck cbc at f/u u/s ------

## 2013-11-12 NOTE — ED Notes (Signed)
MD at bedside. 

## 2013-11-14 NOTE — Telephone Encounter (Signed)
Patient notified 12/17/4 3:48 pm (see labs)

## 2014-01-27 ENCOUNTER — Encounter (INDEPENDENT_AMBULATORY_CARE_PROVIDER_SITE_OTHER): Payer: Self-pay

## 2014-01-27 ENCOUNTER — Encounter: Payer: Self-pay | Admitting: Neurology

## 2014-01-27 ENCOUNTER — Ambulatory Visit (INDEPENDENT_AMBULATORY_CARE_PROVIDER_SITE_OTHER): Payer: 59 | Admitting: Neurology

## 2014-01-27 VITALS — BP 106/66 | HR 85 | Resp 18 | Ht 66.25 in | Wt 158.0 lb

## 2014-01-27 DIAGNOSIS — G43109 Migraine with aura, not intractable, without status migrainosus: Secondary | ICD-10-CM

## 2014-01-27 NOTE — Progress Notes (Signed)
[facility]  Provider:  Melvyn Novas, M D  Referring Provider: Bennye Alm, MD Primary Care Physician:  No PCP Per Patient  Chief Complaint  Patient presents with  . New Evaluation    Room 11  . Migraine    HPI:  Jenna Turner  28 y.o. , a Caucasian, left handed female, is seen here as a referral from Dr. Farrel Gobble for migraine headaches in relation to oestrogen containing birth control pills.     Mrs Coull reports that she had suffered from frequent migraines until her neurologist had suggested to wean her off estrogen containing hormonal contraceptives- and started her on topiramate. Her  migraines decreased in frequency and intensity .She had chosen an intrauterine device for contraception. Unfortunately , she now had hemorrhagic ovarian cysts - several times by now. This is a very painful condition and she has lost days at work and just has not felt very well during the times use cysts have erupted. The patient is not to return to a hormonal birth control pill in order to replace the intrauterine device. Her gynecologist referred her here to see if probably can be found to use an oral contraceptive with old stimulating the migraines. This is either further reports that some of her migrants profoundly severe and let for example to speech arrest, she never developed a hemiparesis or any hemi-numbness.  She usually has an aura 30- 40 minutes prior to headache onset,  and she is nauseated as well as photo and phono- phobic  that time.  She never vomiting .  The aura  was typically a visual aura of flickering lights. Migraines started in her mid teens, but were identified as migraines only 4 years ago. She begun taking birth control at age 8, and she remembers now having lost many days in middle school due to what was than called " headaches".  She had worst headaches on monophasic preparations, with weight gain, mood swings  and hot flashes. She has not tried triphasic  products, but fears she couldn't take it as regularly as required. We discussed the Nuva ring, which is a compromise option.       Review of Systems: Out of a complete 14 system review, the patient complains of only the following symptoms, and all other reviewed systems are negative.  anemia , headaches, .   History   Social History  . Marital Status: Single    Spouse Name: N/A    Number of Children: 0  . Years of Education: Masters   Occupational History  .     Social History Main Topics  . Smoking status: Never Smoker   . Smokeless tobacco: Never Used  . Alcohol Use: 0.0 oz/week     Comment: occasionally  . Drug Use: No  . Sexual Activity: Yes    Partners: Male    Birth Control/ Protection: IUD     Comment: mirena   Other Topics Concern  . Not on file   Social History Narrative   Patient is single and lives alone.   Patient works at [employer] and works full-time.   Patient has a Event organiser.   Patient is left-handed.   Patient drinks 1-2 Diet Coke per day.    Family History  Problem Relation Age of Onset  . Osteopenia Mother   . Hearing loss Mother     Past Medical History  Diagnosis Date  . Migraine with aura     couldn't talk  .  IUD (intrauterine device) in place 4 2 13     Mirena inserted 02-27-12  . Ovarian cyst 11/2012    left    Past Surgical History  Procedure Laterality Date  . Gum graft  10/2012    No current outpatient prescriptions on file.   No current facility-administered medications for this visit.    Allergies as of 01/27/2014 - Review Complete 01/27/2014  Allergen Reaction Noted  . Morphine and related Anaphylaxis and Hives 11/09/2012  . Sulfa antibiotics Hives 11/09/2012    Vitals: BP 106/66  Pulse 85  Resp 18  Ht 5' 6.25" (1.683 m)  Wt 158 lb (71.668 kg)  BMI 25.30 kg/m2 Last Weight:  Wt Readings from Last 1 Encounters:  01/27/14 158 lb (71.668 kg)   Last Height:   Ht Readings from Last 1 Encounters:   01/27/14 5' 6.25" (1.683 m)    Physical exam:  General: The patient is awake, alert and appears not in acute distress. The patient is well groomed. Head: Normocephalic, atraumatic. Neck is supple. Mallampati 2 , neck circumference:  15 . Cardiovascular:  Regular rate and rhythm, without  murmurs or carotid bruit, and without distended neck veins. Respiratory: Lungs are clear to auscultation. Skin:  Without evidence of edema, or rash Trunk: BMI normal .  Neurologic exam : The patient is awake and alert, oriented to place and time.  Memory subjective described as intact.  There is a normal attention span & concentration ability. Speech is fluent without dysarthria, dysphonia or aphasia. Mood and affect are appropriate.  Cranial nerves: Pupils are equal and briskly reactive to light. Funduscopic exam without evidence of pallor or edema.  Extraocular movements  in vertical and horizontal planes intact and without nystagmus. Visual fields by finger perimetry are intact. Hearing to finger rub intact.  Facial sensation intact to fine touch. Facial motor strength is symmetric and tongue and uvula move midline.  Motor exam:  Normal tone and normal muscle bulk and symmetric normal strength in all extremities.  Sensory:  Fine touch, pinprick and vibration were tested in all extremities. Proprioception normal.  Coordination: Rapid alternating movements in the fingers/hands is tested and normal. Finger-to-nose maneuver tested and normal without evidence of ataxia, dysmetria or tremor.  Gait and station: Patient walks without assistive device . Deep tendon reflexes: in the  upper and lower extremities are symmetric and intact. Babinski maneuver response is down going.   Assessment:  After physical and neurologic examination, review of laboratory studies, imaging, neurophysiology testing and pre-existing records, assessment is  Migraine headaches exacerbated by estrogen , but not catamenial  migraines.  Migraines were bi -temporal and would not sleep off- lasting often 12 hours or longer 3 times a month  Topiramate has been discontinued, after racing heart rate increased and she lost appetite.   Plan:  Treatment plan and additional workup : Patient will chose the Nuva ring , which allows for a medium dose of hormone on sustained release. I like for her to consider Propanolol as a preventive for migraine should these return.

## 2014-01-29 ENCOUNTER — Telehealth: Payer: Self-pay | Admitting: Emergency Medicine

## 2014-01-29 DIAGNOSIS — Z30432 Encounter for removal of intrauterine contraceptive device: Secondary | ICD-10-CM

## 2014-01-29 NOTE — Telephone Encounter (Signed)
Message copied by Joeseph AmorFAST, Ashliegh Parekh L on Thu Jan 29, 2014  3:40 PM ------      Message from: Douglass RiversLATHROP, Milo Schreier      Created: Wed Jan 28, 2014  7:37 PM       We can precert it that way and discuss if she want it out and start the nuvaring as neuro cleared her.      ----- Message -----         From: Almedia Ballsracy Burleigh Brockmann, RN         Sent: 01/28/2014  12:29 PM           To: Bennye Almracy H Lathrop, MD            Does she need an appointment for IUD removal?      ----- Message -----         From: Bennye Almracy H Lathrop, MD         Sent: 01/28/2014  11:38 AM           To: Almedia Ballsracy Pierre Dellarocco, RN            Just got her consult from neuro-pt will need a f/u appt with us, please call pt and notify             ------

## 2014-01-29 NOTE — Telephone Encounter (Signed)
Message left to return call to Eleftheria Taborn at [phone].    

## 2014-02-02 NOTE — Telephone Encounter (Signed)
Spoke with pt to schedule consult and possible IUD removal. Pt available tomorrow. Sched OV with TL at 4 pm. Will precert IUD removal in case pt elects to have it removed. Advised pt someone would call with OOP cost. Pt agreeable.

## 2014-02-02 NOTE — Telephone Encounter (Signed)
Called patient. Voicemail confirmed mobile #. Left message advising patient that per insurance quote received, she will have 0 liability for iud removal performed in the office.

## 2014-02-03 ENCOUNTER — Ambulatory Visit (INDEPENDENT_AMBULATORY_CARE_PROVIDER_SITE_OTHER): Payer: 59 | Admitting: Gynecology

## 2014-02-03 ENCOUNTER — Encounter: Payer: Self-pay | Admitting: Gynecology

## 2014-02-03 VITALS — BP 121/75 | HR 76 | Resp 12 | Ht 66.25 in | Wt 159.0 lb

## 2014-02-03 DIAGNOSIS — Z30432 Encounter for removal of intrauterine contraceptive device: Secondary | ICD-10-CM

## 2014-02-03 DIAGNOSIS — Z309 Encounter for contraceptive management, unspecified: Secondary | ICD-10-CM

## 2014-02-03 DIAGNOSIS — G43109 Migraine with aura, not intractable, without status migrainosus: Secondary | ICD-10-CM

## 2014-02-03 MED ORDER — ETONOGESTREL-ETHINYL ESTRADIOL 0.12-0.015 MG/24HR VA RING: VAGINAL_RING | VAGINAL | Status: DC

## 2014-02-03 NOTE — Patient Instructions (Signed)
place nuvaring today and use condom back up for the first ring

## 2014-02-03 NOTE — Progress Notes (Signed)
27 yrs Caucasian Single G0P0 LMP unknown      Presents for Mirena removal.  Denies any vaginal symptoms or STD concerns.  Plans for contraception arenuvaring.  Pt had IUD placed due to migraine with aura and associated aphasia. Pt has been seen by neurology and has been cleared to restart nuvaring.  Pt has been having recurrent ovarian cyst on the IUD.          HPI neg.  Exam: time, place and person Abdomen: soft non-tender Groin:no inguinal nodes palpated    Pelvic exam:Pelvic exam: normal external genitalia, vulva, vagina, cervix, uterus and adnexa.  Procedure: Speculum placed, cervix visualized.  IUD string visualized, grasp with ring forceps, with gentle traction IUD removed intact.  IUD shown to patient and discarded. Speculum removed.   Assessment:Mirena removal Pt tolerated procedure well.  Plan: Begin contraceptive choice of nuvaring Rxyes   Pt to place nuvaring today and use condom back up for the first ring.  Pt instructed to contact either us or neuro if migraines return.  [place] and will plan to [place] there Questions addressed

## 2014-04-09 ENCOUNTER — Telehealth: Payer: Self-pay | Admitting: Gynecology

## 2014-04-09 DIAGNOSIS — Z309 Encounter for contraceptive management, unspecified: Secondary | ICD-10-CM

## 2014-04-09 MED ORDER — ETONOGESTREL-ETHINYL ESTRADIOL 0.12-0.015 MG/24HR VA RING: VAGINAL_RING | VAGINAL | Status: DC

## 2014-04-09 NOTE — Telephone Encounter (Signed)
Left message to call Jenna Turner at [phone].  Verify pharmacy to send rx too as patient was [place]oc[place] at last OV.

## 2014-04-09 NOTE — Telephone Encounter (Signed)
Patient says that she lost there rx for the nuva ring when she was moving last week. Wants to know if we can send in another one for her to pharmacy.

## 2014-04-09 NOTE — Telephone Encounter (Signed)
Spoke with patient. [place] and [facility] [address] Advised prescription sent to new pharmacy of choice. Patient states that her Nuvaring was thrown away during her move and would like to know if insurance will cover it again this soon. Advised patient to refill prescription again before the month is up will result in patient paying OOP for medication. Patient is agreeable and verbalizes understanding.  Routing to provider for final review. Patient agreeable to disposition. Will close encounter

## 2014-09-15 IMAGING — CT CT ABD-PELV W/ CM
2 of 4 series · 14 of 32 positions shown, 19 images · IV contrast (water/omni  & 75ml omni 300)
Comparison: CT abdomen and pelvis 12/27/2007.

CLINICAL DATA: Abdominal pain.

CT ABDOMEN AND PELVIS WITH CONTRAST
TECHNIQUE: Multidetector CT imaging of the abdomen and pelvis was
performed following the standard protocol during bolus
administration of intravenous contrast.
Contrast: 75mL OMNIPAQUE IOHEXOL 300 MG/ML  SOLN

[Series 2: routine abdomen · axial · 0.65mm/px · z∈[-414,-94]mm · 7 of 83 slices shown, 12 images]
[im 11/83  soft-tissue]
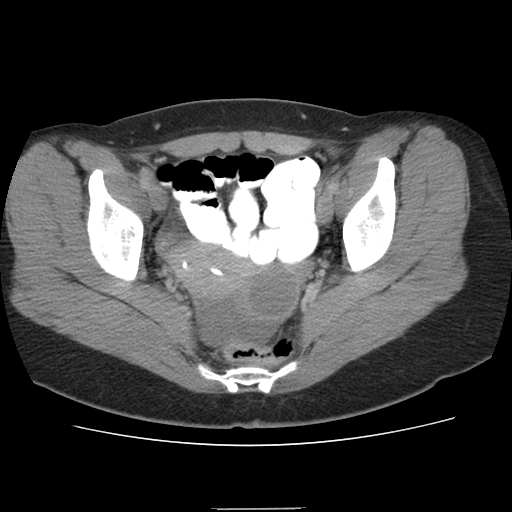
[im 11/83  bone]
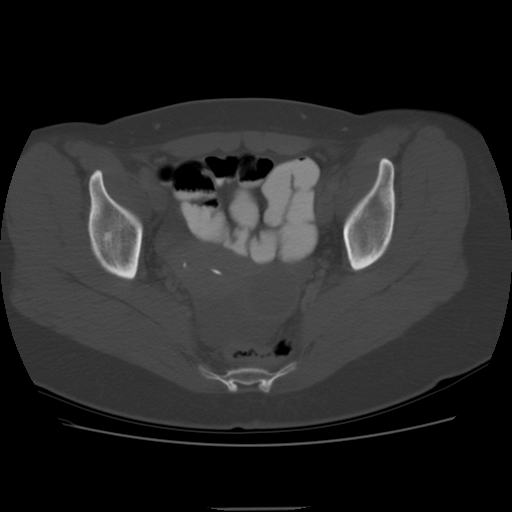
[im 21/83  soft-tissue]
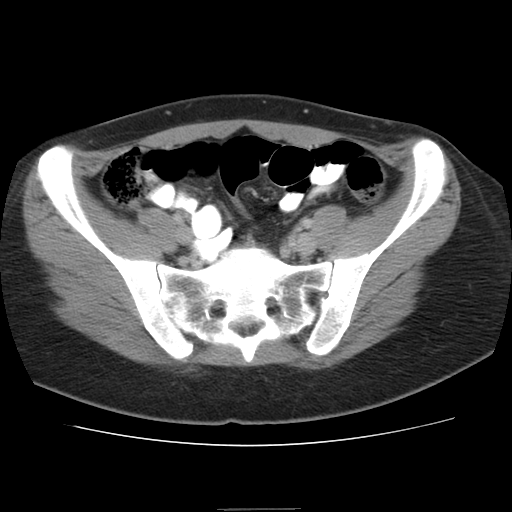
[im 31/83  soft-tissue]
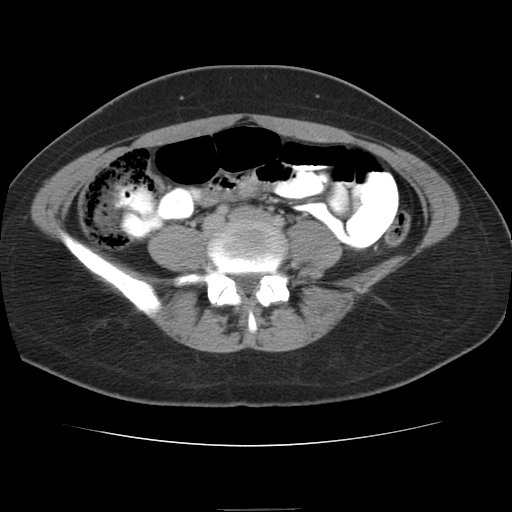
[im 42/83  soft-tissue]
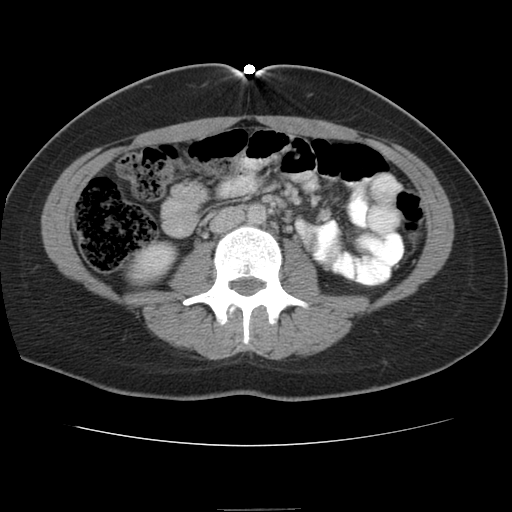
[im 42/83  lung]
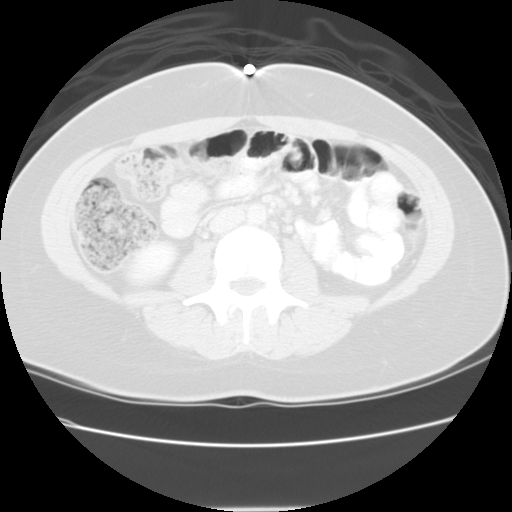
[im 52/83  soft-tissue]
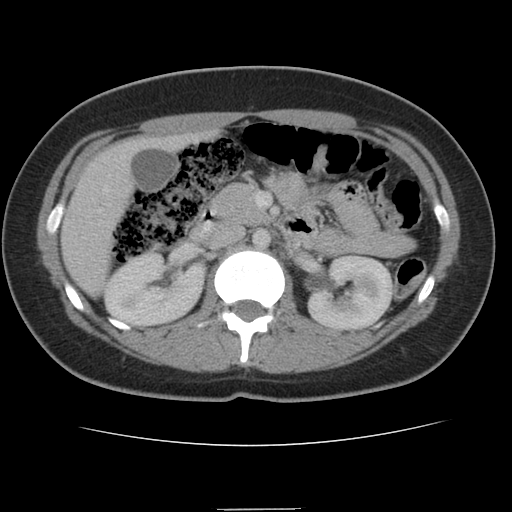
[im 52/83  lung]
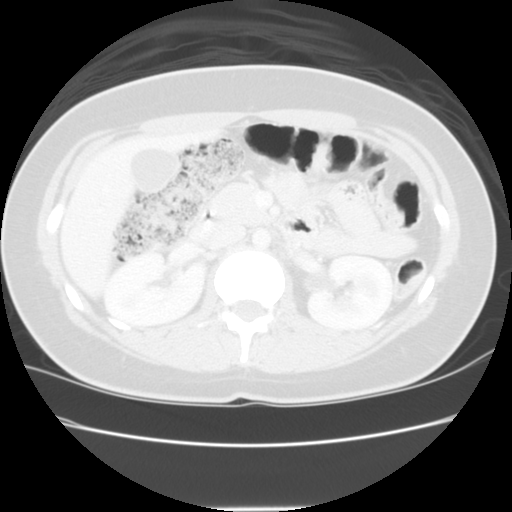
[im 62/83  soft-tissue]
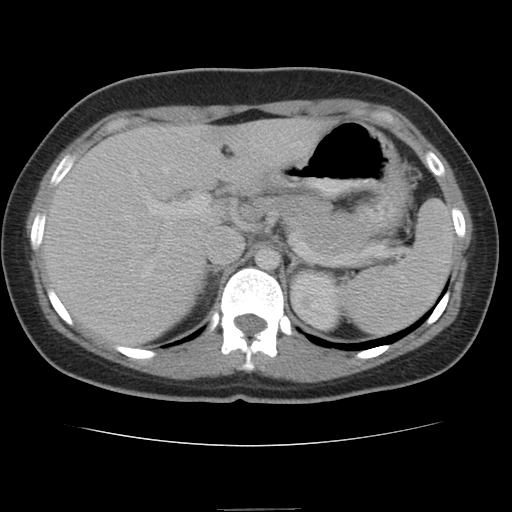
[im 62/83  lung]
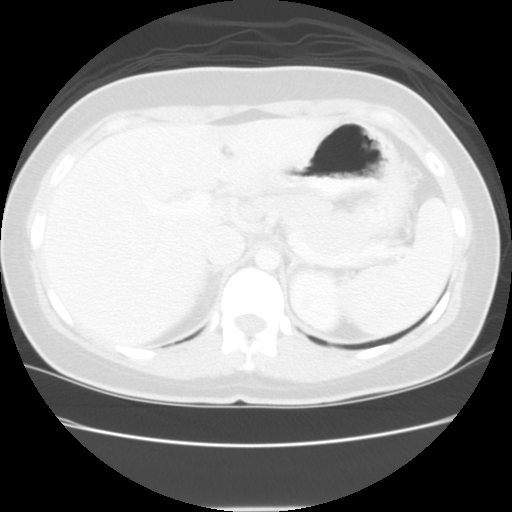
[im 72/83  soft-tissue]
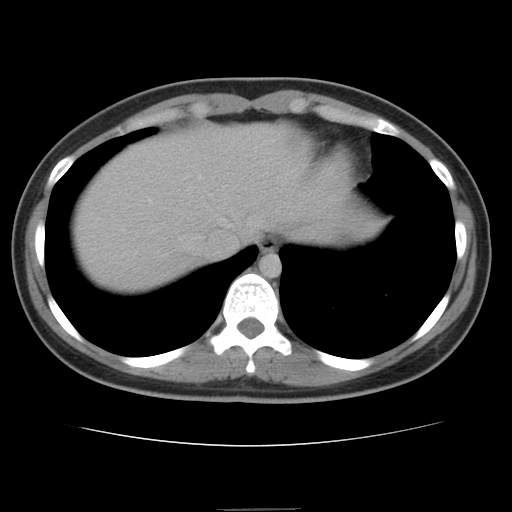
[im 72/83  lung]
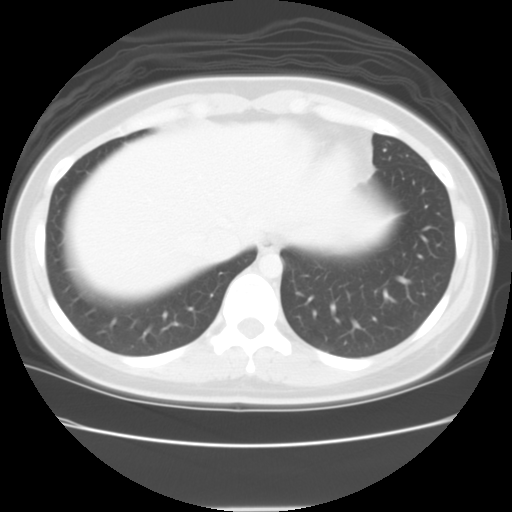

[Series 401: sagittals · sagittal · 0.93mm/px · 7 of 103 slices shown]
[im 11/103  soft-tissue]
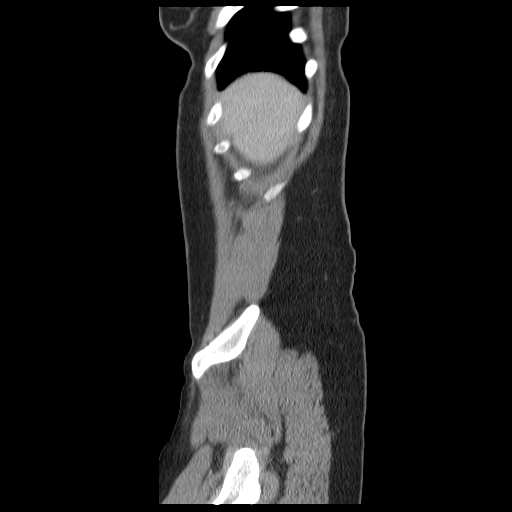
[im 21/103  soft-tissue]
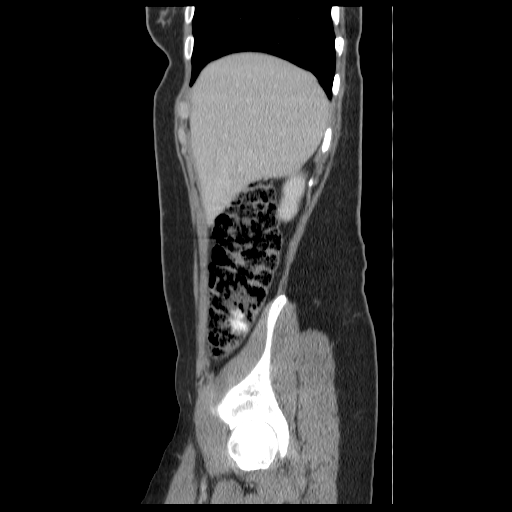
[im 31/103  soft-tissue]
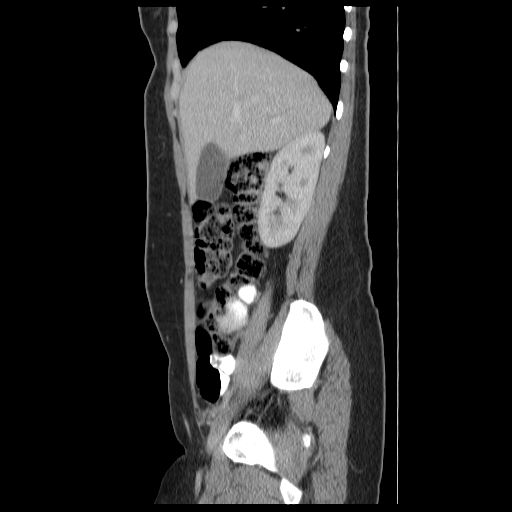
[im 41/103  soft-tissue]
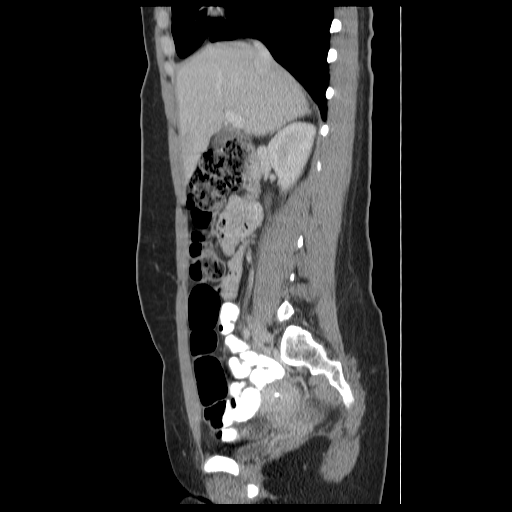
[im 62/103  soft-tissue]
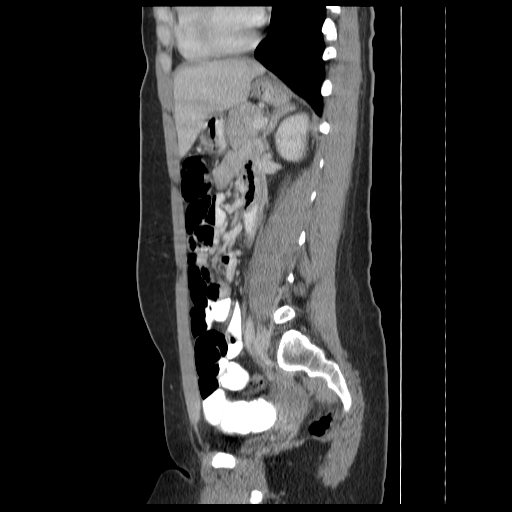
[im 72/103  soft-tissue]
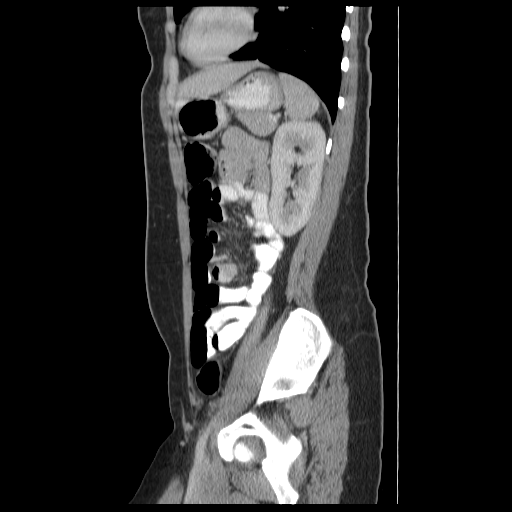
[im 82/103  soft-tissue]
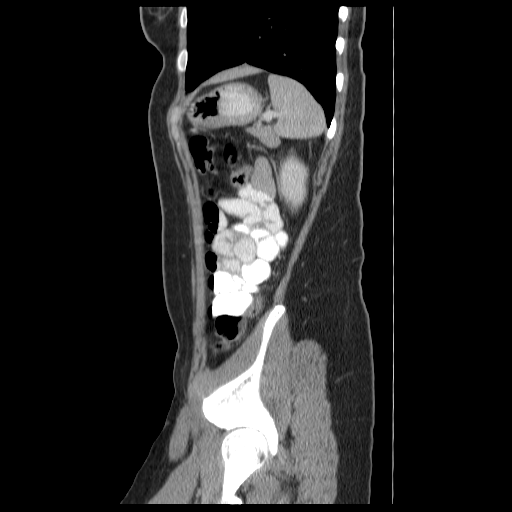

[14 of 32 positions shown; findings below may reference images not displayed]

FINDINGS: Minimal linear atelectasis or scar is seen in the right
lung base.  Lung bases otherwise clear.  No pleural or pericardial
effusion.

The gallbladder, liver, spleen, adrenal glands, pancreas and
kidneys appear normal.

The patient has a left ovarian cyst which measures 3.9 cm AP by
cm transverse by 4.4 cm cranial-caudal.  There is a small volume of
free pelvic fluid which is greater than typically seen in
physiologic change.  Right ovary is unremarkable.  IUD is in place
in the uterus.  The stomach and small and large bowel appear
normal.  The appendix is not discretely visualized but no evidence
inflammatory process is seen in the right lower quadrant.  There is
no lymphadenopathy.  No focal bony abnormality.
IMPRESSION: Findings suggestive of partial rupture of a left ovarian cyst.  The
study is otherwise unremarkable.

## 2015-01-13 ENCOUNTER — Telehealth: Payer: Self-pay | Admitting: *Deleted

## 2015-01-13 NOTE — Telephone Encounter (Signed)
Reviewed with Dr Hyacinth MeekerMiller. Follow-up with patient to confirm has established care in [place] and had follow-up of previous ovarian cysts.   Call to patient for status update on Nuvaring and see if has had follow-up ultrasound. Left message to call back.

## 2015-01-14 NOTE — Telephone Encounter (Signed)
Return call to patient, left message to call back. Can speak with Kennon RoundsSally or triage nurses.

## 2015-01-14 NOTE — Telephone Encounter (Signed)
Returning a call to Jenna Turner. °

## 2015-01-20 NOTE — Telephone Encounter (Signed)
Call to patient. She states she has established with PCP office that also has GYN care in the same location. She states she has not actually has the annual exam but also has not has any problems with cysts. States she wasn't aware she needed a follow-up ultrasound and reports she is doing well on Nuvaring. Advised that since due for annual, recommend schedule AEX with new provider and transfer records from our office for their review and follow-up of previous ovarian cyst. Patient states she will schedule.   (Per chart review: Ultrasound 10-2013 at hospital. IUD removed by Dr Farrel GobbleLathrop [phone]).  Routing to provider for final review. Patient agreeable to disposition. Will close encounter  Routing to Dr Edward JollySilva while Dr Hyacinth MeekerMiller is out of office.

## 2023-03-19 NOTE — Progress Notes (Deleted)
NEW PATIENT Date of Service/Encounter:  03/19/23 Referring provider: none-self referred Primary care provider: Patient, No Pcp Per  Subjective:  Jenna Turner is a 37 y.o. female with a PMHx of migraines presenting today for evaluation of "allergies". History obtained from: chart review and {Persons; PED relatives w/patient:19415::"patient"}.   [employer] CONSULT ONLY  *** Other allergy screening: Asthma: {Blank single:19197::"yes","no"} Rhino conjunctivitis: {Blank single:19197::"yes","no"} Food allergy: {Blank single:19197::"yes","no"} Medication allergy: {Blank single:19197::"yes","no"} Hymenoptera allergy: {Blank single:19197::"yes","no"} Urticaria: {Blank single:19197::"yes","no"} Eczema:{Blank single:19197::"yes","no"} History of recurrent infections suggestive of immunodeficency: {Blank single:19197::"yes","no"} ***Vaccinations are up to date.   Past Medical History: Past Medical History:  Diagnosis Date   IUD (intrauterine device) in place Mirena inserted 02-27-12   Migraine with aura    couldn't talk   Ovarian cyst 11/2012   left   Medication List:  Current Outpatient Medications  Medication Sig Dispense Refill   etonogestrel-ethinyl estradiol (NUVARING) 0.12-0.015 MG/24HR vaginal ring Insert vaginally and leave in place for 4 consecutive weeks, then remove for 3d 1 each 11   No current facility-administered medications for this visit.   Known Allergies:  Allergies  Allergen Reactions   Morphine And Related Anaphylaxis and Hives   Sulfa Antibiotics Hives   Past Surgical History: Past Surgical History:  Procedure Laterality Date   gum graft  10/2012   Family History: Family History  Problem Relation Age of Onset   Osteopenia Mother    Hearing loss Mother    Social History: Jodean lives ***.   ROS:  All other systems negative except as noted per HPI.  Objective:  There were no vitals taken for this visit. There is no height or weight on file  to calculate BMI. Physical Exam:  General Appearance:  Alert, cooperative, no distress, appears stated age  Head:  Normocephalic, without obvious abnormality, atraumatic  Eyes:  Conjunctiva clear, EOM's intact  Nose: Nares normal, {Blank multiple:19196:a:"***","hypertrophic turbinates","normal mucosa","no visible anterior polyps","septum midline"}  Throat: Lips, tongue normal; teeth and gums normal, {Blank multiple:19196:a:"***","normal posterior oropharynx","tonsils 2+","tonsils 3+","no tonsillar exudate","+ cobblestoning"}  Neck: Supple, symmetrical  Lungs:   {Blank multiple:19196:a:"***","clear to auscultation bilaterally","end-expiratory wheezing","wheezing throughout"}, Respirations unlabored, {Blank multiple:19196:a:"***","no coughing","intermittent dry coughing"}  Heart:  {Blank multiple:19196:a:"***","regular rate and rhythm","no murmur"}, Appears well perfused  Extremities: No edema  Skin: {Blank multiple:19196:a:"***","Skin color, texture, turgor normal","no rashes or lesions on visualized portions of skin"}  Neurologic: No gross deficits     Diagnostics: Spirometry:  Tracings reviewed. Her effort: {Blank single:19197::"Good reproducible efforts.","It was hard to get consistent efforts and there is a question as to whether this reflects a maximal maneuver.","Poor effort, data can not be interpreted.","Variable effort-results affected.","decent for first attempt at spirometry."} FVC: ***L (pre), ***L  (post) FEV1: ***L, ***% predicted (pre), ***L, ***% predicted (post) FEV1/FVC ratio: *** (pre), *** (post) Interpretation: {Blank single:19197::"Spirometry consistent with mild obstructive disease","Spirometry consistent with moderate obstructive disease","Spirometry consistent with severe obstructive disease","Spirometry consistent with possible restrictive disease","Spirometry consistent with mixed obstructive and restrictive disease","Spirometry uninterpretable due to  technique","Spirometry consistent with normal pattern","No overt abnormalities noted given today's efforts"} with *** bronchodilator response  Skin Testing: {Blank single:19197::"Select foods","Environmental allergy panel","Environmental allergy panel and select foods","Food allergy panel","None","Deferred due to recent antihistamines use"}. *** Adequate controls. Results discussed with patient/family.   {Blank single:19197::"Allergy testing results were read and interpreted by myself, documented by clinical staff."," "}  Assessment and Plan  ***  {Blank single:19197::"This note in its entirety was forwarded to the Provider who requested this consultation."}  Thank you  for your kind referral. I appreciate the opportunity to take part in Ellisyn's care. Please do not hesitate to contact me with questions.***  Sincerely,  Tonny Bollman, MD [facility]

## 2023-03-21 ENCOUNTER — Ambulatory Visit: Payer: Self-pay | Admitting: Internal Medicine

## 2023-03-26 ENCOUNTER — Ambulatory Visit: Payer: Self-pay | Admitting: Internal Medicine

## 2023-05-02 ENCOUNTER — Ambulatory Visit: Payer: Self-pay | Admitting: Internal Medicine

## 2023-06-01 DIAGNOSIS — M7989 Other specified soft tissue disorders: Secondary | ICD-10-CM | POA: Diagnosis not present

## 2023-06-05 DIAGNOSIS — Z803 Family history of malignant neoplasm of breast: Secondary | ICD-10-CM | POA: Diagnosis not present

## 2023-06-05 DIAGNOSIS — Z9189 Other specified personal risk factors, not elsewhere classified: Secondary | ICD-10-CM | POA: Diagnosis not present

## 2023-06-08 DIAGNOSIS — L723 Sebaceous cyst: Secondary | ICD-10-CM | POA: Diagnosis not present

## 2023-06-11 DIAGNOSIS — N9089 Other specified noninflammatory disorders of vulva and perineum: Secondary | ICD-10-CM | POA: Diagnosis not present

## 2023-08-30 ENCOUNTER — Other Ambulatory Visit (HOSPITAL_COMMUNITY)
Admission: RE | Admit: 2023-08-30 | Discharge: 2023-08-30 | Disposition: A | Discharge status: Home / Self Care | Payer: BC Managed Care – PPO | Source: Ambulatory Visit | Attending: Obstetrics and Gynecology | Admitting: Obstetrics and Gynecology

## 2023-08-30 ENCOUNTER — Ambulatory Visit (INDEPENDENT_AMBULATORY_CARE_PROVIDER_SITE_OTHER): Payer: BC Managed Care – PPO | Admitting: Obstetrics and Gynecology

## 2023-08-30 ENCOUNTER — Encounter: Payer: Self-pay | Admitting: Obstetrics and Gynecology

## 2023-08-30 VITALS — BP 106/70 | HR 76 | Ht 65.75 in | Wt 175.0 lb

## 2023-08-30 DIAGNOSIS — Z1501 Genetic susceptibility to malignant neoplasm of breast: Secondary | ICD-10-CM

## 2023-08-30 DIAGNOSIS — T07XXXA Unspecified multiple injuries, initial encounter: Secondary | ICD-10-CM

## 2023-08-30 DIAGNOSIS — Z01419 Encounter for gynecological examination (general) (routine) without abnormal findings: Secondary | ICD-10-CM | POA: Insufficient documentation

## 2023-08-30 NOTE — Progress Notes (Signed)
37 y.o. y.o. female here for annual exam.    Patient's last menstrual period was 08/15/2023 (exact date). Period Duration (Days): 4 Period Pattern: Regular Menstrual Flow:  (1 day thats heavy & then light) Menstrual Control: Other (Comment) (menstrual cup) Dysmenorrhea: (!) Moderate Dysmenorrhea Symptoms: Cramping, Other (Comment), Headache (migraines) She is not bothered by her periods. Mother and Maunt with breast cancer She has had multiple fractures and mother with osteoporosis.  She is wondering if she can do a bone scan.  Pelvic discharge: denies Pelvic pain: denies Birth control: tubal ligation Last mammogram: never   Blood pressure 106/70, pulse 76, height 5' 5.75" (1.67 m), weight 175 lb (79.4 kg), last menstrual period 08/15/2023, SpO2 96%.  No results found for: "DIAGPAP", "HPVHIGH", "ADEQPAP"  GYN HISTORY: No results found for: "DIAGPAP", "HPVHIGH", "ADEQPAP"  OB History  Gravida Para Term Preterm AB Living  0 0          SAB IAB Ectopic Multiple Live Births               Past Medical History:  Diagnosis Date   Anemia    in the past   IUD (intrauterine device) in place 02/27/2012   Mirena inserted 02-27-12   Migraine with aura    couldn't talk   Ovarian cyst 11/27/2012   left    Past Surgical History:  Procedure Laterality Date   BILATERAL SALPINGECTOMY     gum graft  10/27/2012    Current Outpatient Medications on File Prior to Visit  Medication Sig Dispense Refill   B Complex Vitamins (B COMPLEX PO) Take by mouth.     MAGNESIUM GLYCINATE PO Take by mouth.     Multiple Vitamin (MULTIVITAMIN PO) Take by mouth.     spironolactone (ALDACTONE) 50 MG tablet Take 50 mg by mouth daily.     tretinoin (RETIN-A) 0.05 % cream Apply topically.     VITAMIN D PO Take by mouth.     No current facility-administered medications on file prior to visit.    Social History   Socioeconomic History   Marital status: Married    Spouse name: Not on file    Number of children: 0   Years of education: Masters   Highest education level: Not on file  Occupational History    Employer: [employer]or, [employer]  Tobacco Use   Smoking status: Never   Smokeless tobacco: Never  Substance and Sexual Activity   Alcohol use: Yes    Comment: occasionally   Drug use: No   Sexual activity: Yes    Partners: Male    Birth control/protection: Surgical    Comment: tubes removed  Other Topics Concern   Not on file  Social History Narrative   Patient is single and lives alone.   Patient works at [employer] and works full-time.   Patient has a Event organiser.   Patient is left-handed.   Patient drinks 1-2 Diet Coke per day.   Social Determinants of Health   Financial Resource Strain: Not on file  Food Insecurity: Not on file  Transportation Needs: Not on file  Physical Activity: Insufficiently Active (10/11/2018)   [facility]   Exercise Vital Sign    Days of Exercise per Week: 1 day    Minutes of Exercise per Session: 60 min  Stress: Not on file  Social Connections: Not on file  Intimate Partner Violence: Not on file    Family History  Problem Relation Age of Onset   Breast cancer Mother    Osteopenia Mother    Hearing loss Mother    Breast cancer Maternal Aunt    Hypertension Maternal Grandmother    Kidney cancer Maternal Grandfather    Heart attack Paternal Grandmother      Allergies  Allergen Reactions   Morphine And Codeine Anaphylaxis and Hives   Sulfa Antibiotics Hives      Patient's last menstrual period was Patient's last menstrual period was 08/15/2023 (exact date)..            Review of Systems Alls systems reviewed and are negative.     Physical Exam Vitals and nursing note reviewed. Exam conducted with a chaperone present.  Constitutional:      Appearance: Normal appearance.  HENT:     Head: Normocephalic.  Neck:     Thyroid: No thyroid  mass, thyromegaly or thyroid tenderness.  Cardiovascular:     Rate and Rhythm: Normal rate and regular rhythm.  Pulmonary:     Effort: Pulmonary effort is normal.     Breath sounds: Normal breath sounds.  Chest:  Breasts:    Right: Normal. No swelling, bleeding, mass, nipple discharge, skin change or tenderness.     Left: Normal. No swelling, bleeding, mass, nipple discharge, skin change or tenderness.  Abdominal:     Palpations: Abdomen is soft.     Tenderness: There is no guarding or rebound.  Genitourinary:    General: Normal vulva.     Labia:        Right: No rash, tenderness or lesion.        Left: No rash, tenderness or lesion.      Urethra: No prolapse or urethral pain.     Vagina: Normal.     Cervix: Normal.     Uterus: Normal.      Adnexa: Right adnexa normal and left adnexa normal.  Musculoskeletal:        General: Normal range of motion.     Cervical back: Full passive range of motion without pain and normal range of motion.     Right lower leg: No edema.     Left lower leg: No edema.  Skin:    General: Skin is warm.  Neurological:     Mental Status: She is alert.       A:         Well Woman GYN exam                             P:        Pap smear collected             Encouraged annual mammogram screening with family history.  Cancer center recommended twice yearly with alternating MRI at age 67.  Referral to genetic counselor for brca testing                     Labs -see orders             Discussed breast self exams            Encouraged calcium and vit D H/o  multiple fractures: consider baseline dxa but needs z score testing.  Very important at least 50 if not approved with insurance.  Earley Favor

## 2023-08-31 LAB — COMPREHENSIVE METABOLIC PANEL
AG Ratio: 2.1 (calc) (ref 1.0–2.5)
ALT: 12 U/L (ref 6–29)
AST: 10 U/L (ref 10–30)
Albumin: 5 g/dL (ref 3.6–5.1)
Alkaline phosphatase (APISO): 72 U/L (ref 31–125)
BUN: 9 mg/dL (ref 7–25)
CO2: 28 mmol/L (ref 20–32)
Calcium: 9.7 mg/dL (ref 8.6–10.2)
Chloride: 102 mmol/L (ref 98–110)
Creat: 0.55 mg/dL (ref 0.50–0.97)
Globulin: 2.4 g/dL (ref 1.9–3.7)
Glucose, Bld: 95 mg/dL (ref 65–99)
Potassium: 4.6 mmol/L (ref 3.5–5.3)
Sodium: 138 mmol/L (ref 135–146)
Total Bilirubin: 1.6 mg/dL — ABNORMAL HIGH (ref 0.2–1.2)
Total Protein: 7.4 g/dL (ref 6.1–8.1)

## 2023-08-31 LAB — CBC
HCT: 38.4 % (ref 35.0–45.0)
Hemoglobin: 12.8 g/dL (ref 11.7–15.5)
MCH: 30.7 pg (ref 27.0–33.0)
MCHC: 33.3 g/dL (ref 32.0–36.0)
MCV: 92.1 fL (ref 80.0–100.0)
MPV: 10.1 fL (ref 7.5–12.5)
Platelets: 278 10*3/uL (ref 140–400)
RBC: 4.17 10*6/uL (ref 3.80–5.10)
RDW: 11.5 % (ref 11.0–15.0)
WBC: 5.6 10*3/uL (ref 3.8–10.8)

## 2023-08-31 LAB — LIPID PANEL
Cholesterol: 188 mg/dL (ref <200)
HDL: 70 mg/dL (ref >=50)
LDL Cholesterol (Calc): 103 mg/dL — ABNORMAL HIGH
Non-HDL Cholesterol (Calc): 118 mg/dL (ref <130)
Total CHOL/HDL Ratio: 2.7 (calc) (ref <5.0)
Triglycerides: 66 mg/dL (ref <150)

## 2023-08-31 LAB — VITAMIN D 25 HYDROXY (VIT D DEFICIENCY, FRACTURES): Vit D, 25-Hydroxy: 66 ng/mL (ref 30–100)

## 2023-08-31 LAB — TSH: TSH: 0.81 m[IU]/L

## 2023-09-03 LAB — CYTOLOGY - PAP
Comment: NEGATIVE
Diagnosis: NEGATIVE
High risk HPV: NEGATIVE

## 2023-09-17 DIAGNOSIS — M25531 Pain in right wrist: Secondary | ICD-10-CM | POA: Diagnosis not present

## 2023-09-21 ENCOUNTER — Ambulatory Visit
Admission: EM | Admit: 2023-09-21 | Discharge: 2023-09-21 | Disposition: A | Discharge status: Home / Self Care | Payer: BC Managed Care – PPO | Attending: Emergency Medicine | Admitting: Emergency Medicine

## 2023-09-21 DIAGNOSIS — J0100 Acute maxillary sinusitis, unspecified: Secondary | ICD-10-CM

## 2023-09-21 MED ORDER — AMOXICILLIN-POT CLAVULANATE 875-125 MG PO TABS: 1.0000 | ORAL_TABLET | Freq: Two times a day (BID) | ORAL | 0 refills | Status: AC

## 2023-09-21 MED ORDER — IPRATROPIUM BROMIDE 0.03 % NA SOLN: 2.0000 | Freq: Two times a day (BID) | NASAL | 12 refills | Status: DC

## 2023-09-21 MED ORDER — FLUCONAZOLE 150 MG PO TABS: 150.0000 mg | ORAL_TABLET | Freq: Every day | ORAL | 0 refills | Status: AC

## 2023-09-21 MED ORDER — IPRATROPIUM BROMIDE 0.03 % NA SOLN: 2.0000 | Freq: Two times a day (BID) | NASAL | 0 refills | Status: AC

## 2023-09-21 NOTE — ED Provider Notes (Signed)
Renaldo Fiddler    CSN: [id number] Arrival date & time: 09/21/23  1613      History   Chief Complaint Chief Complaint  Patient presents with   Cough    HPI Jenna Turner is a 37 y.o. female.   Patient presents for evaluation of of nasal congestion, rhinorrhea, sinus pain and pressure, sore throat, intermittent generalized headaches and nonproductive cough present for 9 days.  Sore throat has resolved.  Sinus pain and pressure primarily across the forehead associated bilateral ear fullness and popping, feels like she is underwater.  Has attempted use of pseudoephedrine and TheraFlu which has provided relief but symptoms have persisted.  Recently traveled to and from [place].  No known sick contacts.  Decreased appetite but able to tolerate some food and liquids.   Past Medical History:  Diagnosis Date   Anemia    in the past   IUD (intrauterine device) in place 02/27/2012   Mirena inserted 02-27-12   Migraine with aura    couldn't talk   Ovarian cyst 11/27/2012   left    Patient Active Problem List   Diagnosis Date Noted   H/O migraine with aura 11/11/2013   Hemorrhagic ovarian cyst 11/11/2013   Contraception management 11/09/2012   Pap smear, as part of routine gynecological examination 11/09/2012   Migraine with aura    IUD (intrauterine device) in place 02/27/2012    Past Surgical History:  Procedure Laterality Date   BILATERAL SALPINGECTOMY     gum graft  10/27/2012    OB History     Gravida  0   Para  0   Term      Preterm      AB      Living         SAB      IAB      Ectopic      Multiple      Live Births               Home Medications    Prior to Admission medications   Medication Sig Start Date End Date Taking? Authorizing Provider  amoxicillin-clavulanate (AUGMENTIN) 875-125 MG tablet Take 1 tablet by mouth every 12 (twelve) hours for 10 days. 09/21/23 10/01/23 Yes Milano Rosevear R, NP  fluconazole (DIFLUCAN) 150 MG  tablet Take 1 tablet (150 mg total) by mouth daily for 2 doses. 09/21/23 09/23/23 Yes Cutler Sunday R, NP  B Complex Vitamins (B COMPLEX PO) Take by mouth.    [provider]  ipratropium (ATROVENT) 0.03 % nasal spray Place 2 sprays into both nostrils every 12 (twelve) hours. 09/21/23   Myrick Mcnairy, Elita Boone, NP  MAGNESIUM GLYCINATE PO Take by mouth.    [provider]  Multiple Vitamin (MULTIVITAMIN PO) Take by mouth.    [provider]  spironolactone (ALDACTONE) 50 MG tablet Take 50 mg by mouth daily.    [provider]  tretinoin (RETIN-A) 0.05 % cream Apply topically. 09/19/22   [provider]  VITAMIN D PO Take by mouth.    [provider]    Family History Family History  Problem Relation Age of Onset   Breast cancer Mother    Osteopenia Mother    Hearing loss Mother    Breast cancer Maternal Aunt    Hypertension Maternal Grandmother    Kidney cancer Maternal Grandfather    Heart attack Paternal Grandmother     Social History Social History   Tobacco Use  Smoking status: Never   Smokeless tobacco: Never  Substance Use Topics   Alcohol use: Yes    Comment: occasionally   Drug use: No     Allergies   Morphine and codeine and Sulfa antibiotics   Review of Systems Review of Systems   Physical Exam Triage Vital Signs ED Triage Vitals [09/21/23 1615]  Encounter Vitals Group     BP 131/85     Systolic BP Percentile      Diastolic BP Percentile      Pulse Rate 77     Resp 16     Temp 98.1 F (36.7 C)     Temp Source Oral     SpO2 98 %     Weight      Height      Head Circumference      Peak Flow      Pain Score 0     Pain Loc      Pain Education      Exclude from Growth Chart    No data found.  Updated Vital Signs BP 131/85 (BP Location: Left Arm)   Pulse 77   Temp 98.1 F (36.7 C) (Oral)   Resp 16   LMP 09/10/2023 (Approximate)   SpO2 98%   Visual Acuity Right Eye Distance:   Left Eye  Distance:   Bilateral Distance:    Right Eye Near:   Left Eye Near:    Bilateral Near:     Physical Exam Constitutional:      Appearance: Normal appearance.  HENT:     Head: Normocephalic.     Right Ear: Tympanic membrane, ear canal and external ear normal.     Left Ear: Tympanic membrane, ear canal and external ear normal.     Nose: Congestion present.     Right Sinus: No maxillary sinus tenderness or frontal sinus tenderness.     Left Sinus: Maxillary sinus tenderness present. No frontal sinus tenderness.     Mouth/Throat:     Pharynx: No oropharyngeal exudate or posterior oropharyngeal erythema.     Tonsils: Tonsillar exudate present. 3+ on the right. 3+ on the left.  Cardiovascular:     Rate and Rhythm: Normal rate and regular rhythm.     Pulses: Normal pulses.     Heart sounds: Normal heart sounds.  Pulmonary:     Effort: Pulmonary effort is normal.     Breath sounds: Normal breath sounds.  Musculoskeletal:        General: Normal range of motion.     Cervical back: Normal range of motion and neck supple.  Skin:    General: Skin is warm and dry.  Neurological:     Mental Status: She is alert and oriented to person, place, and time. Mental status is at baseline.      UC Treatments / Results  Labs (all labs ordered are listed, but only abnormal results are displayed) Labs Reviewed - No data to display  EKG   Radiology No results found.  Procedures Procedures (including critical care time)  Medications Ordered in UC Medications - No data to display  Initial Impression / Assessment and Plan / UC Course  I have reviewed the triage vital signs and the nursing notes.  Pertinent labs & imaging results that were available during my care of the patient were reviewed by me and considered in my medical decision making (see chart for details).  Acute nonrecurrent maxillary sinusitis  Patient is in no signs of  distress nor toxic appearing.  Vital signs are stable.   Low suspicion for pneumonia, pneumothorax or bronchitis and therefore will defer imaging.  Patient and symptomology consistent with above diagnoses and symptoms have been present for 9 days without signs of resolution therefore will provide bacterial coverage, Augmentin sent to pharmacy, Diflucan sent prophylactically she endorses yeast infection with use of medication.  Prescribed Atrovent nasal spray for additional supportive care recommended continued use of Sudafed.May use additional over-the-counter medications as needed for supportive care.  May follow-up with urgent care as needed if symptoms persist or worsen.      Final diagnoses:  Acute non-recurrent maxillary sinusitis     Discharge Instructions      Today you are being treated for sinus infection based on your exam and symptoms  Begin Augmentin every morning and every evening for 10 days should see small improvement day by day but takes 48 hours for medicine to fully get in system, may use Diflucan as needed for treatment if you begin to have vaginal itching or irritation, take 1 tablet if symptoms begin during treatment and then second tablet after all antibiotic completed  May use nasal spray twice daily for additional comfort to clear out sinuses    You can take Tylenol and/or Ibuprofen as needed for fever reduction and pain relief.   For cough: honey 1/2 to 1 teaspoon (you can dilute the honey in water or another fluid).  You can also use guaifenesin and dextromethorphan for cough. You can use a humidifier for chest congestion and cough.  If you don't have a humidifier, you can sit in the bathroom with the hot shower running.      For sore throat: try warm salt water gargles, cepacol lozenges, throat spray, warm tea or water with lemon/honey, popsicles or ice, or OTC cold relief medicine for throat discomfort.   For congestion: take a daily anti-histamine like Zyrtec, Claritin, and a oral decongestant, such as pseudoephedrine.   You can also use Flonase 1-2 sprays in each nostril daily.   It is important to stay hydrated: drink plenty of fluids (water, gatorade/powerade/pedialyte, juices, or teas) to keep your throat moisturized and help further relieve irritation/discomfort.    ED Prescriptions     Medication Sig Dispense Auth. Provider   amoxicillin-clavulanate (AUGMENTIN) 875-125 MG tablet Take 1 tablet by mouth every 12 (twelve) hours for 10 days. 20 tablet Otto Felkins R, NP   fluconazole (DIFLUCAN) 150 MG tablet Take 1 tablet (150 mg total) by mouth daily for 2 doses. 2 tablet Lola Czerwonka R, NP   ipratropium (ATROVENT) 0.03 % nasal spray  (Status: Discontinued) Place 2 sprays into both nostrils every 12 (twelve) hours. 30 mL Kery Haltiwanger R, NP   ipratropium (ATROVENT) 0.03 % nasal spray Place 2 sprays into both nostrils every 12 (twelve) hours. 30 mL Valinda Hoar, NP      PDMP not reviewed this encounter.   Valinda Hoar, [place] 09/21/23 [phone]

## 2023-09-21 NOTE — Discharge Instructions (Signed)
Today you are being treated for sinus infection based on your exam and symptoms  Begin Augmentin every morning and every evening for 10 days should see small improvement day by day but takes 48 hours for medicine to fully get in system, may use Diflucan as needed for treatment if you begin to have vaginal itching or irritation, take 1 tablet if symptoms begin during treatment and then second tablet after all antibiotic completed  May use nasal spray twice daily for additional comfort to clear out sinuses    You can take Tylenol and/or Ibuprofen as needed for fever reduction and pain relief.   For cough: honey 1/2 to 1 teaspoon (you can dilute the honey in water or another fluid).  You can also use guaifenesin and dextromethorphan for cough. You can use a humidifier for chest congestion and cough.  If you don't have a humidifier, you can sit in the bathroom with the hot shower running.      For sore throat: try warm salt water gargles, cepacol lozenges, throat spray, warm tea or water with lemon/honey, popsicles or ice, or OTC cold relief medicine for throat discomfort.   For congestion: take a daily anti-histamine like Zyrtec, Claritin, and a oral decongestant, such as pseudoephedrine.  You can also use Flonase 1-2 sprays in each nostril daily.   It is important to stay hydrated: drink plenty of fluids (water, gatorade/powerade/pedialyte, juices, or teas) to keep your throat moisturized and help further relieve irritation/discomfort.

## 2023-09-21 NOTE — ED Triage Notes (Signed)
Patient presents to UC for cough, SOB, sore throat, post nasal drip x 9 days. States she is traveling Monday so wants to make sure it is not an infection. Treating symptoms with sudafed and theraflu.   Denies fever.

## 2023-10-02 DIAGNOSIS — I493 Ventricular premature depolarization: Secondary | ICD-10-CM | POA: Diagnosis not present

## 2023-10-31 DIAGNOSIS — L2089 Other atopic dermatitis: Secondary | ICD-10-CM | POA: Diagnosis not present

## 2023-10-31 DIAGNOSIS — L218 Other seborrheic dermatitis: Secondary | ICD-10-CM | POA: Diagnosis not present

## 2023-10-31 DIAGNOSIS — L708 Other acne: Secondary | ICD-10-CM | POA: Diagnosis not present

## 2023-11-09 DIAGNOSIS — D649 Anemia, unspecified: Secondary | ICD-10-CM | POA: Diagnosis not present

## 2023-11-09 DIAGNOSIS — Z23 Encounter for immunization: Secondary | ICD-10-CM | POA: Diagnosis not present

## 2023-11-09 DIAGNOSIS — E559 Vitamin D deficiency, unspecified: Secondary | ICD-10-CM | POA: Diagnosis not present

## 2023-11-09 DIAGNOSIS — Z0000 Encounter for general adult medical examination without abnormal findings: Secondary | ICD-10-CM | POA: Diagnosis not present

## 2023-11-09 DIAGNOSIS — I493 Ventricular premature depolarization: Secondary | ICD-10-CM | POA: Diagnosis not present

## 2023-12-03 DIAGNOSIS — R102 Pelvic and perineal pain: Secondary | ICD-10-CM | POA: Diagnosis not present

## 2023-12-13 DIAGNOSIS — M25531 Pain in right wrist: Secondary | ICD-10-CM | POA: Diagnosis not present

## 2023-12-18 DIAGNOSIS — R102 Pelvic and perineal pain: Secondary | ICD-10-CM | POA: Diagnosis not present

## 2023-12-20 DIAGNOSIS — J3089 Other allergic rhinitis: Secondary | ICD-10-CM | POA: Diagnosis not present

## 2023-12-28 DIAGNOSIS — M25531 Pain in right wrist: Secondary | ICD-10-CM | POA: Diagnosis not present

## 2023-12-31 DIAGNOSIS — R102 Pelvic and perineal pain: Secondary | ICD-10-CM | POA: Diagnosis not present

## 2024-01-08 ENCOUNTER — Emergency Department
Admission: EM | Admit: 2024-01-08 | Discharge: 2024-01-09 | Disposition: A | Discharge status: Home / Self Care | Payer: BC Managed Care – PPO | Attending: Emergency Medicine | Admitting: Emergency Medicine

## 2024-01-08 DIAGNOSIS — R112 Nausea with vomiting, unspecified: Secondary | ICD-10-CM | POA: Insufficient documentation

## 2024-01-08 DIAGNOSIS — R109 Unspecified abdominal pain: Secondary | ICD-10-CM | POA: Diagnosis not present

## 2024-01-08 DIAGNOSIS — R197 Diarrhea, unspecified: Secondary | ICD-10-CM | POA: Diagnosis not present

## 2024-01-08 DIAGNOSIS — R1084 Generalized abdominal pain: Secondary | ICD-10-CM | POA: Insufficient documentation

## 2024-01-08 LAB — CBC
HCT: 41.8 % (ref 36.0–46.0)
Hemoglobin: 14.2 g/dL (ref 12.0–15.0)
MCH: 30.1 pg (ref 26.0–34.0)
MCHC: 34 g/dL (ref 30.0–36.0)
MCV: 88.7 fL (ref 80.0–100.0)
Platelets: 264 10*3/uL (ref 150–400)
RBC: 4.71 MIL/uL (ref 3.87–5.11)
RDW: 11.9 % (ref 11.5–15.5)
WBC: 12.2 10*3/uL — ABNORMAL HIGH (ref 4.0–10.5)
nRBC: 0 % (ref 0.0–0.2)

## 2024-01-08 LAB — URINALYSIS, ROUTINE W REFLEX MICROSCOPIC
Bacteria, UA: NONE SEEN
Bilirubin Urine: NEGATIVE
Glucose, UA: NEGATIVE mg/dL
Hgb urine dipstick: NEGATIVE
Ketones, ur: NEGATIVE mg/dL
Leukocytes,Ua: NEGATIVE
Nitrite: NEGATIVE
Protein, ur: 30 mg/dL — AB
Specific Gravity, Urine: 1.026 (ref 1.005–1.030)
pH: 5 (ref 5.0–8.0)

## 2024-01-08 LAB — COMPREHENSIVE METABOLIC PANEL
ALT: 20 U/L (ref 0–44)
AST: 18 U/L (ref 15–41)
Albumin: 5 g/dL (ref 3.5–5.0)
Alkaline Phosphatase: 55 U/L (ref 38–126)
Anion gap: 14 (ref 5–15)
BUN: 21 mg/dL — ABNORMAL HIGH (ref 6–20)
CO2: 20 mmol/L — ABNORMAL LOW (ref 22–32)
Calcium: 8.9 mg/dL (ref 8.9–10.3)
Chloride: 103 mmol/L (ref 98–111)
Creatinine, Ser: 0.55 mg/dL (ref 0.44–1.00)
GFR, Estimated: >60 mL/min (ref >60)
Glucose, Bld: 146 mg/dL — ABNORMAL HIGH (ref 70–99)
Potassium: 3.9 mmol/L (ref 3.5–5.1)
Sodium: 137 mmol/L (ref 135–145)
Total Bilirubin: 1.7 mg/dL — ABNORMAL HIGH (ref 0.0–1.2)
Total Protein: 7.6 g/dL (ref 6.5–8.1)

## 2024-01-08 LAB — POC URINE PREG, ED: Preg Test, Ur: NEGATIVE

## 2024-01-08 LAB — LIPASE, BLOOD: Lipase: 35 U/L (ref 11–51)

## 2024-01-08 MED ORDER — ONDANSETRON 4 MG PO TBDP
4.0000 mg | ORAL_TABLET | Freq: Once | ORAL | Status: AC | PRN
  Administered 2024-01-08: 4 mg via ORAL
  Filled 2024-01-08: qty 1

## 2024-01-08 NOTE — ED Triage Notes (Signed)
Pt arrived POV for abd pain n/v/d that started today, denies sick contact, just arrived back home today from vacation, plane landed earlier today. Husband works at [employer] and reports the [employer] virus has been going around school there. Pt thinks she may have contracted the virus from her husband. Afebrile, NAD noted. VSS.

## 2024-01-09 MED ORDER — ONDANSETRON HCL 4 MG/2ML IJ SOLN
4.0000 mg | Freq: Once | INTRAMUSCULAR | Status: AC
  Administered 2024-01-09: 4 mg via INTRAVENOUS
  Filled 2024-01-09: qty 2

## 2024-01-09 MED ORDER — DICYCLOMINE HCL 20 MG PO TABS: 20.0000 mg | ORAL_TABLET | Freq: Four times a day (QID) | ORAL | 0 refills | Status: AC

## 2024-01-09 MED ORDER — FAMOTIDINE IN NACL 20-0.9 MG/50ML-% IV SOLN
20.0000 mg | Freq: Once | INTRAVENOUS | Status: AC
  Administered 2024-01-09: 20 mg via INTRAVENOUS
  Filled 2024-01-09: qty 50

## 2024-01-09 MED ORDER — DICYCLOMINE HCL 10 MG/ML IM SOLN
20.0000 mg | Freq: Once | INTRAMUSCULAR | Status: AC
  Administered 2024-01-09: 20 mg via INTRAMUSCULAR
  Filled 2024-01-09: qty 2

## 2024-01-09 MED ORDER — ONDANSETRON 4 MG PO TBDP: 4.0000 mg | ORAL_TABLET | Freq: Three times a day (TID) | ORAL | 0 refills | Status: AC | PRN

## 2024-01-09 MED ORDER — ONDANSETRON 4 MG PO TBDP
4.0000 mg | ORAL_TABLET | Freq: Once | ORAL | Status: AC
  Administered 2024-01-09: 4 mg via ORAL
  Filled 2024-01-09: qty 1

## 2024-01-09 MED ORDER — SODIUM CHLORIDE 0.9 % IV BOLUS
1000.0000 mL | Freq: Once | INTRAVENOUS | Status: AC
  Administered 2024-01-09: 1000 mL via INTRAVENOUS

## 2024-01-09 NOTE — Discharge Instructions (Signed)
You may take medicines as needed for abdominal cramps and nausea.  Clear liquids x 12 hours, then bland diet x 3 days, then slowly advance diet as tolerated.  Return to the ER for worsening symptoms, persistent vomiting, difficulty breathing or other concerns.

## 2024-01-09 NOTE — ED Provider Notes (Signed)
[facility] Provider Note    Event Date/Time   First MD Initiated Contact with Patient 01/09/24 0000     (approximate)   History   Abdominal Pain   HPI  Jenna Turner is a 38 y.o. female who presents to the ED with a chief complaint of abdominal cramps, nausea/vomiting/diarrhea.  Patient stepped off the plane from the [facility] in the afternoon and symptoms began in the evening.  Denies fever/chills, chest pain, cough, shortness of breath, dysuria.     Past Medical History   Past Medical History:  Diagnosis Date   Anemia    in the past   IUD (intrauterine device) in place 02/27/2012   Mirena inserted 02-27-12   Migraine with aura    couldn't talk   Ovarian cyst 11/27/2012   left     Active Problem List   Patient Active Problem List   Diagnosis Date Noted   H/O migraine with aura 11/11/2013   Hemorrhagic ovarian cyst 11/11/2013   Contraception management 11/09/2012   Pap smear, as part of routine gynecological examination 11/09/2012   Migraine with aura    IUD (intrauterine device) in place 02/27/2012     Past Surgical History   Past Surgical History:  Procedure Laterality Date   BILATERAL SALPINGECTOMY     gum graft  10/27/2012     Home Medications   Prior to Admission medications   Medication Sig Start Date End Date Taking? Authorizing Provider  dicyclomine (BENTYL) 20 MG tablet Take 1 tablet (20 mg total) by mouth every 6 (six) hours. 01/09/24  Yes Irean Hong, MD  ondansetron (ZOFRAN-ODT) 4 MG disintegrating tablet Take 1 tablet (4 mg total) by mouth every 8 (eight) hours as needed for nausea or vomiting. 01/09/24  Yes Irean Hong, MD  B Complex Vitamins (B COMPLEX PO) Take by mouth.    [provider]  ipratropium (ATROVENT) 0.03 % nasal spray Place 2 sprays into both nostrils every 12 (twelve) hours. 09/21/23   White, Elita Boone, NP  MAGNESIUM GLYCINATE PO Take by mouth.    [provider]  Multiple  Vitamin (MULTIVITAMIN PO) Take by mouth.    [provider]  spironolactone (ALDACTONE) 50 MG tablet Take 50 mg by mouth daily.    [provider]  tretinoin (RETIN-A) 0.05 % cream Apply topically. 09/19/22   [provider]  VITAMIN D PO Take by mouth.    [provider]     Allergies  Morphine and codeine and Sulfa antibiotics   Family History   Family History  Problem Relation Age of Onset   Breast cancer Mother    Osteopenia Mother    Hearing loss Mother    Breast cancer Maternal Aunt    Hypertension Maternal Grandmother    Kidney cancer Maternal Grandfather    Heart attack Paternal Grandmother      Physical Exam  Triage Vital Signs: ED Triage Vitals  Encounter Vitals Group     BP 01/08/24 2139 99/67     Systolic BP Percentile --      Diastolic BP Percentile --      Pulse Rate 01/08/24 2139 93     Resp 01/08/24 2139 16     Temp 01/08/24 2139 98.6 F (37 C)     Temp Source 01/08/24 2139 Oral     SpO2 01/08/24 2139 100 %     Weight 01/08/24 2141 170 lb (77.1 kg)     Height  01/08/24 2141 5\' 6"  (1.676 m)     Head Circumference --      Peak Flow --      Pain Score 01/08/24 2141 6     Pain Loc --      Pain Education --      Exclude from Growth Chart --     Updated Vital Signs: BP 114/70 (BP Location: Left Arm)   Pulse 87   Temp 100.1 F (37.8 C) (Oral)   Resp 18   Ht 5\' 6"  (1.676 m)   Wt 77.1 kg   LMP 12/22/2023 (Exact Date)   SpO2 99%   BMI 27.44 kg/m    General: Awake, no distress.  CV:  RRR.  Good peripheral perfusion.  Resp:  Normal effort.  CVAT. Abd:  Nontender.  No distention.  Other:  Mildly dry mucous membranes.   ED Results / Procedures / Treatments  Labs (all labs ordered are listed, but only abnormal results are displayed) Labs Reviewed  COMPREHENSIVE METABOLIC PANEL - Abnormal; Notable for the following components:      Result Value   CO2 20 (*)    Glucose, Bld 146 (*)    BUN 21 (*)    Total  Bilirubin 1.7 (*)    All other components within normal limits  CBC - Abnormal; Notable for the following components:   WBC 12.2 (*)    All other components within normal limits  URINALYSIS, ROUTINE W REFLEX MICROSCOPIC - Abnormal; Notable for the following components:   Color, Urine AMBER (*)    APPearance HAZY (*)    Protein, ur 30 (*)    All other components within normal limits  LIPASE, BLOOD  POC URINE PREG, ED     EKG  None   RADIOLOGY None   Official radiology report(s): No results found.   PROCEDURES:  Critical Care performed: No  Procedures   MEDICATIONS ORDERED IN ED: Medications  ondansetron (ZOFRAN-ODT) disintegrating tablet 4 mg (4 mg Oral Given 01/08/24 2145)  sodium chloride 0.9 % bolus 1,000 mL (0 mLs Intravenous Stopped 01/09/24 0155)  ondansetron (ZOFRAN) injection 4 mg (4 mg Intravenous Given 01/09/24 0049)  famotidine (PEPCID) IVPB 20 mg premix (0 mg Intravenous Stopped 01/09/24 0127)  dicyclomine (BENTYL) injection 20 mg (20 mg Intramuscular Given 01/09/24 0127)  ondansetron (ZOFRAN-ODT) disintegrating tablet 4 mg (4 mg Oral Provided for home use 01/09/24 0216)     IMPRESSION / MDM / ASSESSMENT AND PLAN / ED COURSE  I reviewed the triage vital signs and the nursing notes.                             38 year old female presenting with abdominal cramps, N/V/D. Differential diagnosis includes, but is not limited to, ovarian cyst, ovarian torsion, acute appendicitis, diverticulitis, urinary tract infection/pyelonephritis, endometriosis, bowel obstruction, colitis, renal colic, gastroenteritis, hernia, fibroids, endometriosis, pregnancy related pain including ectopic pregnancy, etc. I personally reviewed patient's records and note a cardiology office visit on 11/09/2023 for ventricular premature depolarization.  Patient's presentation is most consistent with acute complicated illness / injury requiring diagnostic workup.  Laboratory results demonstrate  mild leukocytosis, unremarkable electrolytes, negative UA for infection.  Patient already feeling better after ODT Zofran administered at triage.  Given her dry mucous membranes, will administer IV fluids, IV Zofran, IV Pepcid and reassess.  She is crunching ice chips and tolerating them well.  Clinical Course as of 01/09/24 0426  Wed Jan 09, 2024  0204 Patient feeling significantly better after IM Bentyl.  Abdomen reexamined which remains benign.  Will discharge home with as needed Zofran/Bentyl and patient will follow-up closely with her PCP as needed.  Strict return precautions given.  Patient and family member verbalized understanding and agree with plan of care. [JS]    Clinical Course User Index [JS] Irean Hong, MD   FINAL CLINICAL IMPRESSION(S) / ED DIAGNOSES   Final diagnoses:  Nausea vomiting and diarrhea  Generalized abdominal pain     Rx / DC Orders   ED Discharge Orders          Ordered    dicyclomine (BENTYL) 20 MG tablet  Every 6 hours        01/09/24 0205    ondansetron (ZOFRAN-ODT) 4 MG disintegrating tablet  Every 8 hours PRN        01/09/24 0205             Note:  This document was prepared using Dragon voice recognition software and may include unintentional dictation errors.   Irean Hong, MD 01/09/24 [id number]

## 2024-01-22 DIAGNOSIS — R102 Pelvic and perineal pain: Secondary | ICD-10-CM | POA: Diagnosis not present

## 2024-02-07 DIAGNOSIS — R102 Pelvic and perineal pain: Secondary | ICD-10-CM | POA: Diagnosis not present

## 2024-03-06 DIAGNOSIS — R102 Pelvic and perineal pain: Secondary | ICD-10-CM | POA: Diagnosis not present

## 2024-03-12 DIAGNOSIS — M25531 Pain in right wrist: Secondary | ICD-10-CM | POA: Diagnosis not present

## 2024-03-21 DIAGNOSIS — M25531 Pain in right wrist: Secondary | ICD-10-CM | POA: Diagnosis not present

## 2024-03-28 DIAGNOSIS — M25531 Pain in right wrist: Secondary | ICD-10-CM | POA: Diagnosis not present

## 2024-04-10 DIAGNOSIS — M25531 Pain in right wrist: Secondary | ICD-10-CM | POA: Diagnosis not present

## 2024-04-25 DIAGNOSIS — M25531 Pain in right wrist: Secondary | ICD-10-CM | POA: Diagnosis not present

## 2024-05-09 DIAGNOSIS — G43109 Migraine with aura, not intractable, without status migrainosus: Secondary | ICD-10-CM | POA: Diagnosis not present

## 2024-05-22 DIAGNOSIS — M25531 Pain in right wrist: Secondary | ICD-10-CM | POA: Diagnosis not present

## 2024-06-17 ENCOUNTER — Other Ambulatory Visit (HOSPITAL_COMMUNITY): Payer: Self-pay | Admitting: Sports Medicine

## 2024-06-17 DIAGNOSIS — M25531 Pain in right wrist: Secondary | ICD-10-CM

## 2024-06-24 ENCOUNTER — Ambulatory Visit
Admission: RE | Admit: 2024-06-24 | Discharge: 2024-06-24 | Disposition: A | Discharge status: Home / Self Care | Source: Ambulatory Visit | Attending: Sports Medicine | Admitting: Sports Medicine

## 2024-06-24 DIAGNOSIS — M79644 Pain in right finger(s): Secondary | ICD-10-CM | POA: Diagnosis not present

## 2024-06-24 DIAGNOSIS — M25531 Pain in right wrist: Secondary | ICD-10-CM | POA: Diagnosis not present

## 2024-06-24 DIAGNOSIS — M25441 Effusion, right hand: Secondary | ICD-10-CM | POA: Diagnosis not present

## 2024-07-14 DIAGNOSIS — M65831 Other synovitis and tenosynovitis, right forearm: Secondary | ICD-10-CM | POA: Diagnosis not present

## 2024-07-24 DIAGNOSIS — Z049 Encounter for examination and observation for unspecified reason: Secondary | ICD-10-CM | POA: Diagnosis not present

## 2024-07-24 DIAGNOSIS — G43719 Chronic migraine without aura, intractable, without status migrainosus: Secondary | ICD-10-CM | POA: Diagnosis not present

## 2024-10-28 DIAGNOSIS — Z01419 Encounter for gynecological examination (general) (routine) without abnormal findings: Secondary | ICD-10-CM | POA: Diagnosis not present

## 2024-10-30 DIAGNOSIS — M25531 Pain in right wrist: Secondary | ICD-10-CM | POA: Diagnosis not present

## 2024-10-30 DIAGNOSIS — G43719 Chronic migraine without aura, intractable, without status migrainosus: Secondary | ICD-10-CM | POA: Diagnosis not present

## 2024-11-05 DIAGNOSIS — R002 Palpitations: Secondary | ICD-10-CM | POA: Diagnosis not present

## 2024-11-10 DIAGNOSIS — I493 Ventricular premature depolarization: Secondary | ICD-10-CM | POA: Diagnosis not present

## 2024-11-10 DIAGNOSIS — Z23 Encounter for immunization: Secondary | ICD-10-CM | POA: Diagnosis not present

## 2024-11-10 DIAGNOSIS — R17 Unspecified jaundice: Secondary | ICD-10-CM | POA: Diagnosis not present

## 2024-11-10 DIAGNOSIS — G43909 Migraine, unspecified, not intractable, without status migrainosus: Secondary | ICD-10-CM | POA: Diagnosis not present

## 2024-11-10 DIAGNOSIS — Z13220 Encounter for screening for lipoid disorders: Secondary | ICD-10-CM | POA: Diagnosis not present

## 2024-11-10 DIAGNOSIS — Z0000 Encounter for general adult medical examination without abnormal findings: Secondary | ICD-10-CM | POA: Diagnosis not present

## 2024-11-13 DIAGNOSIS — M65931 Unspecified synovitis and tenosynovitis, right forearm: Secondary | ICD-10-CM | POA: Diagnosis not present

## 2024-11-13 DIAGNOSIS — M24031 Loose body in right wrist: Secondary | ICD-10-CM | POA: Diagnosis not present

## 2024-11-13 DIAGNOSIS — M25831 Other specified joint disorders, right wrist: Secondary | ICD-10-CM | POA: Diagnosis not present

## 2024-12-14 ENCOUNTER — Other Ambulatory Visit: Payer: Self-pay

## 2024-12-14 ENCOUNTER — Emergency Department

## 2024-12-14 ENCOUNTER — Encounter: Payer: Self-pay | Admitting: *Deleted

## 2024-12-14 ENCOUNTER — Emergency Department
Admission: EM | Admit: 2024-12-14 | Discharge: 2024-12-14 | Disposition: A | Discharge status: Home / Self Care | Attending: Emergency Medicine | Admitting: Emergency Medicine

## 2024-12-14 DIAGNOSIS — J101 Influenza due to other identified influenza virus with other respiratory manifestations: Secondary | ICD-10-CM | POA: Diagnosis not present

## 2024-12-14 DIAGNOSIS — R059 Cough, unspecified: Secondary | ICD-10-CM | POA: Diagnosis present

## 2024-12-14 LAB — RESP PANEL BY RT-PCR (RSV, FLU A&B, COVID)  RVPGX2
Influenza A by PCR: POSITIVE — AB
Influenza B by PCR: NEGATIVE
Resp Syncytial Virus by PCR: NEGATIVE
SARS Coronavirus 2 by RT PCR: NEGATIVE

## 2024-12-14 MED ORDER — BENZONATATE 100 MG PO CAPS: 100.0000 mg | ORAL_CAPSULE | Freq: Three times a day (TID) | ORAL | 0 refills | Status: AC | PRN

## 2024-12-14 MED ORDER — HYDROCOD POLI-CHLORPHE POLI ER 10-8 MG/5ML PO SUER: 5.0000 mL | Freq: Two times a day (BID) | ORAL | 0 refills | Status: AC | PRN

## 2024-12-14 NOTE — ED Provider Notes (Signed)
 "  [facility] Provider Note    Event Date/Time   First MD Initiated Contact with Patient 12/14/24 2121     (approximate)   History   Nasal Congestion   HPI  SHANQUITA RONNING is a 39 y.o. female with PMH of ovarian cyst and migraines presents for evaluation of flulike symptoms.  Patient endorses congestion, fever, body aches, cough, chest pain with coughing and some shortness of breath. Patient was at a work conference in [place] over the past week and just returned today. Her symptoms began on Thursday.      Physical Exam   Triage Vital Signs: ED Triage Vitals  Encounter Vitals Group     BP 12/14/24 2116 116/79     Girls Systolic BP Percentile --      Girls Diastolic BP Percentile --      Boys Systolic BP Percentile --      Boys Diastolic BP Percentile --      Pulse Rate 12/14/24 2116 81     Resp 12/14/24 2116 20     Temp 12/14/24 2116 98.2 F (36.8 C)     Temp Source 12/14/24 2116 Oral     SpO2 12/14/24 2116 100 %     Weight 12/14/24 2114 170 lb (77.1 kg)     Height 12/14/24 2114 5' 5 (1.651 m)     Head Circumference --      Peak Flow --      Pain Score 12/14/24 2114 3     Pain Loc --      Pain Education --      Exclude from Growth Chart --     Most recent vital signs: Vitals:   12/14/24 2116  BP: 116/79  Pulse: 81  Resp: 20  Temp: 98.2 F (36.8 C)  SpO2: 100%   General: Awake, no distress.  CV:  Good peripheral perfusion. RRR. Resp:  Normal effort. CTAB. Abd:  No distention.  Other:     ED Results / Procedures / Treatments   Labs (all labs ordered are listed, but only abnormal results are displayed) Labs Reviewed  RESP PANEL BY RT-PCR (RSV, FLU A&B, COVID)  RVPGX2 - Abnormal; Notable for the following components:      Result Value   Influenza A by PCR POSITIVE (*)    All other components within normal limits    RADIOLOGY  Chest x-ray obtained, I interpreted the images as well as reviewed the radiologist report, which  was negative for any acute cardiopulmonary abnormalities.   PROCEDURES:  Critical Care performed: No  Procedures   MEDICATIONS ORDERED IN ED: Medications - No data to display   IMPRESSION / MDM / ASSESSMENT AND PLAN / ED COURSE  I reviewed the triage vital signs and the nursing notes.                             39 year old female presents for evaluation of flulike symptoms.  Vital signs are stable patient uncomfortable appearing and coughing frequently on exam.  Differential diagnosis includes, but is not limited to, flu, COVID, RSV, other viral URI, bronchitis, pneumonia.  Patient's presentation is most consistent with acute complicated illness / injury requiring diagnostic workup.  Suspect viral URI but given patient's reports of chest pain with coughing and some shortness of breath will obtain chest x-ray to evaluate for pneumonia.  Will also obtain respiratory panel.  Since patient recently traveled from [place] did  consider the possibility of PE however patient is PERC negative.  Patient positive for influenza.  Discussed symptomatic management.  Will send some cough medicine.  She is given a note for work.  She voiced understanding, all questions were answered she was stable at discharge.  Clinical Course as of 12/14/24 2229  Sun Dec 14, 2024  2206 DG Chest 2 View Negative for pneumonia and other abnormalities. [LD]  2207 Resp panel by RT-PCR (RSV, Flu A&B, Covid) Anterior Nasal Swab(!) Positive for influenza A. [LD]    Clinical Course User Index [LD] Cleaster Tinnie LABOR, PA-C     FINAL CLINICAL IMPRESSION(S) / ED DIAGNOSES   Final diagnoses:  Influenza A     Rx / DC Orders   ED Discharge Orders          Ordered    chlorpheniramine-HYDROcodone  (TUSSIONEX) 10-8 MG/5ML  Every 12 hours PRN        12/14/24 2228    benzonatate  (TESSALON ) 100 MG capsule  3 times daily PRN        12/14/24 2228             Note:  This document was prepared using Dragon  voice recognition software and may include unintentional dictation errors.   Cleaster Tinnie LABOR, PA-C 12/14/24 2230    Suzanne Kirsch, MD 12/14/24 [id number]  "

## 2024-12-14 NOTE — Discharge Instructions (Addendum)
 Your chest x-ray was normal.  You tested positive for the flu today.  This is a viral illness which will resolve on its own with time.  You do not need an antibiotic.  You can take over-the-counter cold medicine as needed to manage your symptoms.  If you are taking combination cold medicine keep in mind that this often contains Tylenol  so if you need additional medication for body aches or fever control please take Motrin  or ibuprofen .  Your symptoms should resolve with time, if you have had symptoms for greater than 10 days please be evaluated by another healthcare provider as at this point it may have developed into a bacterial infection which requires a different treatment.  I sent 2 cough medicines to the pharmacy.  The Tussionex should only be taken at night as this medication is sedating and you cannot drive after taking it.  The Tessalon  Perles can be taken throughout the day.  Return to the emergency department with worsening symptoms.

## 2024-12-14 NOTE — ED Triage Notes (Signed)
 Pt to triage via wheelchair.  Pt reports congestion, fever, bodyaches.  Pt has a cough.  Pt took tylenol  at 1300 today.  Pt just flew in from [place] tonight.  Pt alert.
